# Patient Record
Sex: Female | Born: 1950 | Race: White | Hispanic: No | Marital: Married | Smoking: Never smoker
Health system: Southern US, Community
[De-identification: ages and names within clinical notes are randomized; demographics above are authoritative.]

## PROBLEM LIST (undated history)

## (undated) DIAGNOSIS — M810 Age-related osteoporosis without current pathological fracture: Secondary | ICD-10-CM

## (undated) DIAGNOSIS — K219 Gastro-esophageal reflux disease without esophagitis: Secondary | ICD-10-CM

## (undated) DIAGNOSIS — E785 Hyperlipidemia, unspecified: Secondary | ICD-10-CM

## (undated) DIAGNOSIS — F32A Depression, unspecified: Secondary | ICD-10-CM

## (undated) DIAGNOSIS — F329 Major depressive disorder, single episode, unspecified: Secondary | ICD-10-CM

## (undated) DIAGNOSIS — I10 Essential (primary) hypertension: Secondary | ICD-10-CM

## (undated) DIAGNOSIS — R079 Chest pain, unspecified: Secondary | ICD-10-CM

## (undated) DIAGNOSIS — I2510 Atherosclerotic heart disease of native coronary artery without angina pectoris: Secondary | ICD-10-CM

## (undated) HISTORY — DX: Age-related osteoporosis without current pathological fracture: M81.0

## (undated) HISTORY — PX: LUMBAR DISC SURGERY: SHX700

## (undated) HISTORY — PX: CARDIAC CATHETERIZATION: SHX172

## (undated) HISTORY — DX: Gastro-esophageal reflux disease without esophagitis: K21.9

## (undated) HISTORY — DX: Depression, unspecified: F32.A

## (undated) HISTORY — PX: ABDOMINAL EXPLORATION SURGERY: SHX538

## (undated) HISTORY — DX: Major depressive disorder, single episode, unspecified: F32.9

## (undated) HISTORY — DX: Chest pain, unspecified: R07.9

## (undated) HISTORY — DX: Essential (primary) hypertension: I10

## (undated) HISTORY — PX: TONSILLECTOMY: SUR1361

## (undated) HISTORY — DX: Hyperlipidemia, unspecified: E78.5

---

## 1995-03-13 HISTORY — PX: BREAST BIOPSY: SHX20

## 1998-03-02 ENCOUNTER — Other Ambulatory Visit: Admission: RE | Admit: 1998-03-02 | Discharge: 1998-03-02 | Payer: Self-pay | Admitting: Gynecology

## 1999-01-31 ENCOUNTER — Other Ambulatory Visit: Admission: RE | Admit: 1999-01-31 | Discharge: 1999-01-31 | Payer: Self-pay | Admitting: Gynecology

## 2000-03-18 ENCOUNTER — Other Ambulatory Visit: Admission: RE | Admit: 2000-03-18 | Discharge: 2000-03-18 | Payer: Self-pay | Admitting: Gynecology

## 2001-04-01 ENCOUNTER — Other Ambulatory Visit: Admission: RE | Admit: 2001-04-01 | Discharge: 2001-04-01 | Payer: Self-pay | Admitting: Gynecology

## 2002-04-14 ENCOUNTER — Other Ambulatory Visit: Admission: RE | Admit: 2002-04-14 | Discharge: 2002-04-14 | Payer: Self-pay | Admitting: Gynecology

## 2003-04-22 ENCOUNTER — Other Ambulatory Visit: Admission: RE | Admit: 2003-04-22 | Discharge: 2003-04-22 | Payer: Self-pay | Admitting: Gynecology

## 2004-04-27 ENCOUNTER — Other Ambulatory Visit: Admission: RE | Admit: 2004-04-27 | Discharge: 2004-04-27 | Payer: Self-pay | Admitting: Gynecology

## 2004-05-26 ENCOUNTER — Ambulatory Visit: Payer: Self-pay | Admitting: Internal Medicine

## 2004-06-02 ENCOUNTER — Ambulatory Visit: Payer: Self-pay | Admitting: Internal Medicine

## 2005-04-30 ENCOUNTER — Other Ambulatory Visit: Admission: RE | Admit: 2005-04-30 | Discharge: 2005-04-30 | Payer: Self-pay | Admitting: Gynecology

## 2008-06-27 ENCOUNTER — Observation Stay (HOSPITAL_COMMUNITY): Admission: AD | Admit: 2008-06-27 | Discharge: 2008-06-28 | Payer: Self-pay | Admitting: Internal Medicine

## 2008-06-27 ENCOUNTER — Ambulatory Visit: Payer: Self-pay | Admitting: Internal Medicine

## 2009-03-01 ENCOUNTER — Ambulatory Visit (HOSPITAL_COMMUNITY): Admission: RE | Admit: 2009-03-01 | Discharge: 2009-03-02 | Payer: Self-pay | Admitting: Neurosurgery

## 2010-06-12 LAB — CBC
HCT: 37.7 % (ref 36.0–46.0)
Hemoglobin: 13.1 g/dL (ref 12.0–15.0)
MCHC: 34.8 g/dL (ref 30.0–36.0)
MCV: 91.4 fL (ref 78.0–100.0)
Platelets: 220 K/uL (ref 150–400)
RBC: 4.13 MIL/uL (ref 3.87–5.11)
RDW: 12.2 % (ref 11.5–15.5)
WBC: 5.3 K/uL (ref 4.0–10.5)

## 2010-06-21 LAB — BASIC METABOLIC PANEL
Calcium: 8.9 mg/dL (ref 8.4–10.5)
GFR calc non Af Amer: >60 mL/min (ref >60)
Glucose, Bld: 148 mg/dL — ABNORMAL HIGH (ref 70–99)
Sodium: 139 mEq/L (ref 135–145)

## 2010-06-21 LAB — DIFFERENTIAL
Basophils Relative: 0 % (ref 0–1)
Eosinophils Absolute: 0.1 10*3/uL (ref 0.0–0.7)
Monocytes Absolute: 0.3 10*3/uL (ref 0.1–1.0)
Monocytes Relative: 8 % (ref 3–12)
Neutrophils Relative %: 63 % (ref 43–77)

## 2010-06-21 LAB — CBC
Hemoglobin: 13.4 g/dL (ref 12.0–15.0)
MCHC: 33 g/dL (ref 30.0–36.0)
MCHC: 34.6 g/dL (ref 30.0–36.0)
MCV: 92.9 fL (ref 78.0–100.0)
MCV: 94.2 fL (ref 78.0–100.0)
RBC: 4.28 MIL/uL (ref 3.87–5.11)
RBC: 4.32 MIL/uL (ref 3.87–5.11)

## 2010-06-21 LAB — ANTITHROMBIN III: AntiThromb III Func: 136 % — ABNORMAL HIGH (ref 76–126)

## 2010-06-21 LAB — LUPUS ANTICOAGULANT PANEL
DRVVT: 36.1 secs (ref 36.1–47.0)
Lupus Anticoagulant: NOT DETECTED
PTTLA 4:1 Mix: 98.8 secs — ABNORMAL HIGH (ref 36.3–48.8)

## 2010-06-21 LAB — CARDIAC PANEL(CRET KIN+CKTOT+MB+TROPI)
CK, MB: 0.5 ng/mL (ref 0.3–4.0)
CK, MB: 0.5 ng/mL (ref 0.3–4.0)

## 2010-06-21 LAB — CARDIOLIPIN ANTIBODIES, IGG, IGM, IGA

## 2010-06-21 LAB — BETA-2-GLYCOPROTEIN I ABS, IGG/M/A
Beta-2 Glyco I IgG: <4 U/mL (ref <20)
Beta-2-Glycoprotein I IgM: <4 U/mL (ref <10)

## 2010-07-25 NOTE — Cardiovascular Report (Signed)
Maria Allison, FAISON NO.:  [id number]   MEDICAL RECORD NO.:  [id number]          PATIENT TYPE:  INP   LOCATION:  2011                         FACILITY:  [facility]   PHYSICIAN:  Rollene Rotunda, MD, FACCDATE OF BIRTH:  01-06-1951   DATE OF PROCEDURE:  06/28/2008  DATE OF DISCHARGE:  06/28/2008                            CARDIAC CATHETERIZATION   PRIMARY CARE DOCTOR:  Raynelle Jan, MD   PROCEDURE:  Left heart catheterization/coronary arteriography.   INDICATIONS:  Evaluate the patient with chest pain and an stress  echocardiogram suggesting anterior ischemia.   PROCEDURE NOTE:  Left heart catheterization was performed via right the  femoral artery.  The artery was cannulated using the anterior wall  puncture.  A #5-French arterial sheath was inserted via the modified  Seldinger technique.  Preformed, Judkins, and pigtail catheters were  utilized.  The patient tolerated the procedure well and left the lab in  stable condition.   RESULTS:  Hemodynamics:  LV 90/1, AO 93/51.   Coronaries:  The left main was normal.  The LAD was a large vessel  wrapping the apex.  It was normal throughout its course.  First diagonal  was large.  There was a questionable ostial 25% stenosis.  The  circumflex  and the AV groove was normal.  There was mid obtuse  marginal, which was large and normal.  The right coronary artery was  dominant.  It was moderate-sized vessel.  It was normal throughout its  course.  The PDA was small and normal.   Left ventriculogram:  The left ventriculogram was obtained in the RAO  projection.  The EF was 65% with normal wall motion.   CONCLUSION:  Mild coronary plaque.  Normal left ventricular function.   PLAN:  No further cardiac workup is suggested.  The patient will be  referred for evaluation of nonanginal chest pain.  She should continue  the aggressive primary risk reduction given her family history.      Rollene Rotunda, MD, [facility]  Electronically Signed     JH/MEDQ  D:  06/28/2008  T:  06/29/2008  Job:  [id number]   cc:   Raynelle Jan, M.D.

## 2010-07-25 NOTE — H&P (Signed)
Maria Allison, Maria Allison NO.:  [id number]   MEDICAL RECORD NO.:  [id number]          PATIENT TYPE:  INP   LOCATION:  2011                         FACILITY:  [facility]   PHYSICIAN:  Maria Salvia, MD, FACCDATE OF BIRTH:  1950/09/17   DATE OF ADMISSION:  06/27/2008  DATE OF DISCHARGE:                              HISTORY & PHYSICAL   PRIMARY CARE PHYSICIAN:  Dr. Abner Allison in [place]   REASON FOR ADMISSION:  Chest pain and abnormal stress echocardiogram.   HISTORY OF PRESENT ILLNESS:  Ms. Maria Allison is a 60 year old female with no  history of coronary artery disease who presented to her primary  physician's office on June 25, 2008 with a complaint of chest pain.  Her symptoms occurred while washing dishes a few nights ago.  It was  left-sided and sharp.  It resolved with rest.  She continued to feel  weak and also felt weak into the next day.  As noted, she went to her  primary care physician's office and they referred to the emergency room  in [place].  She ruled out for myocardial infarction by enzymes.  She  saw Dr. Josefina Allison of [facility] yesterday who performed a  stress echocardiogram.  The patient exercised for 9 minutes.  She had  borderline EKG changes in the inferolateral leads and questionable  anterior hypokinesis on her echocardiography images.  She noted chest  and throat tightness while exercising.  Several of her family members  follow with doctors in [facility].  She requested transfer to  [facility] for further evaluation and treatment.  In  retrospect, the patient has a 2-3 week history of exertional upper chest  and throat tightness.  She also notes symptoms at rest at times,  especially when she is under a great deal of stress.  She denies  shortness of breath, nausea or diaphoresis.  She continues to note chest  and throat discomfort off and on since admission.   PAST MEDICAL HISTORY:  1. Hyperlipidemia - she is only on  diet therapy at this time.  2. Gastroesophageal reflux disease.  3. Status post laparoscopy and eventual abdominal surgery secondary to      endometriosis.  4. Status post bilateral lumpectomies in the past - benign findings.  5. Status post tonsillectomy.  6. History of the right internal carotid artery stenosis by health      care screening in the past - no formal studies performed.   MEDICATIONS AT HOME:  1. Estradiol 0.1 mg daily.  2. A multivitamin daily.  3. Calcium ready.  4,  Red Yeast Rice.  1. Fish oil.  2. B complex probiotic.  3. Aspirin daily.   ALLERGIES:  NO KNOWN DRUG ALLERGIES.  She is allergic to apples, plums,  almonds and peaches.   SOCIAL HISTORY:  The patient lives in [place] with her husband.  She has one daughter who is in college at [employer].  She admits to just occasional alcohol use.  She denies any tobacco  abuse.   FAMILY HISTORY:  Her father had PCI in his 71s  and a brother had bypass  surgery in his 26s.  There is a family history of portal vein thrombosis  for which family screening has been recommended at [facility], presumably for a  hypercoagulable condition.   REVIEW OF SYSTEMS:  Please see HPI.  Denies fevers, chills, weight  changes, headache, dysuria, hematuria, bright red blood per rectum or  melena, dysphagia, odynophagia, GERD symptoms, orthopnea, PND or pedal  edema, syncope, near-syncope or cough.  She does know weakness and  fatigue.  All other systems reviewed and negative.   PHYSICAL EXAM:  She is a well-nourished, well-developed female in no  acute stress.  Blood pressure 118/85, pulse 70, respirations 16, oxygen saturation 96%  in room air.  HEENT: Normal.  Neck without JVD.  LYMPH NODES: Without lymphadenopathy.  ENDOCRINE:  Without thyromegaly.  CARDIAC:  Normal S1-S2.  Regular rate and rhythm without murmur.  LUNGS: Clear to auscultation bilaterally.  No rales.  SKIN:  Without rash.  ABDOMEN:  Soft,  nontender.  No murmur.  Normoactive bowel sounds.  No  organomegaly.  EXTREMITIES: Without clubbing, cyanosis or edema.  MUSCULOSKELETAL:  Without joint deformity.  Neurologic: She is alert and  oriented x3.  Cranial nerves II-XII are grossly intact.  VASCULAR:  Exam  without carotid bruits bilaterally.  Femoral artery pulses are difficult  to palpate, but without bruits bilaterally.  Chest x-ray was on June 25, 2008 at [facility]:  No active disease.  EKG:  Sinus rhythm,  heart rate 56, normal axis, T-wave inversion of V1-2, low voltage.   LABS:  Hemoglobin 13.1, potassium 4.1, creatinine 0.75, glucose 73.  Cardiac enzymes negative x3.  INR 1.0, total cholesterol 239,  triglycerides 75, LDL 145.     .   ASSESSMENT:  1. Chest and neck pain concerning for unstable angina pectoris with an      abnormal stress echocardiogram.  Cardiac markers have been negative      thus far.  Her EKG is normal.  She continues to have intermittent      symptoms.  2. Dyslipidemia.  3. Family history of coronary artery disease.  4. Questionable carotid stenosis by health fair screening in the past.  5. Questionable family history of hypercoagulable state.   RECOMMENDATIONS:  The patient was also interviewed and examined by Dr.  Graciela Allison.  She will continue to have cardiac markers cycled.  She will be  treated with aspirin, statin heparin and beta-blocker and nitrates as  indicated as well as morphine p.r.n.  The risks and benefits of  proceeding with cardiac catheterization have been discussed with  the patient and her husband and she agrees to proceed.  We will plan on  cardiac catheterization the morning unless she develops objective  evidence of ischemia and ongoing symptoms.  In light of that, she will  need urgent intervention.      Tereso Newcomer, PA-C      Maria Salvia, MD, [facility]  Electronically Signed    SW/MEDQ  D:  06/27/2008  T:  06/27/2008  Job:  [id number]   cc:   Maria Salvia, MD, [facility], MD Yetta Flock

## 2010-07-25 NOTE — Discharge Summary (Signed)
NAMEBROOKELLE, Maria Allison NO.:  [id number]   MEDICAL RECORD NO.:  [id number]          PATIENT TYPE:  INP   LOCATION:  2011                         FACILITY:  [facility]   PHYSICIAN:  Rollene Rotunda, MD, FACCDATE OF BIRTH:  May 23, 1950   DATE OF ADMISSION:  06/27/2008  DATE OF DISCHARGE:  06/28/2008                               DISCHARGE SUMMARY   PRIMARY CARE PHYSICIAN:  Dr. Abner Greenspan down in [place].   DISCHARGING PHYSICIAN:  Rollene Rotunda, MD, [facility]   DISCHARGE DIAGNOSIS:  Chest pain with abnormal stress echocardiogram at  [facility].  A.  Status post cardiac catheterization this admission with normal left  ventricular ejection fraction of 65% and mild coronary plaque.  No  further cardiac workup necessary at this time.  Negative cardiac  markers.   PAST MEDICAL HISTORY:  1. Hyperlipidemia with diet therapy at this time.  2. GERD.   HOSPITAL COURSE:  Ms. Miles is a 60 year old Caucasian female who  presented to her primary care physician's office on April 16 with a  complaint of chest pain.  The patient was referred to the emergency room  in [place].  She ruled out for myocardial infarction by enzymes.  She  saw Dr. Bing Matter at [facility] who performed a stress  echocardiogram.  The patient had borderline EKG changes and questionable  anterior hypokinesis on her echocardiogram.  The patient complained of  chest and throat tightness while exercising during the study.  The  patient requested transfer to [facility] for further evaluation.  The  patient admitted and ruled out for myocardial infarction by cardiac  serial markers.  The patient treated for chest discomfort with  appropriate medications including nitrates, heparin, aspirin, and  morphine.  The patient to the cath lab on April 19, results as stated  above.  Thus far, the patient has tolerated procedure without  complication.  She is currently on bedrest.  At completion of bed rest,  if the patient remains stable, we will tentatively plan on discharging  her home this evening.  She can follow up with Dr. Abner Greenspan in  [place] in the next 2 weeks for postcath visit.  Ms. Haltiwanger has been  given post cardiac catheterization discharge instructions.  She is  encouraged of follow heart-healthy diet.  She may resume her previous  medications including aspirin 81 mg, B complex, multivitamin, red yeast  rice, fish oil, probiotic, Zantac, and estradiol as previously  prescribed.   Duration of discharge encounter less than 30 minutes.      Dorian Pod, ACNP      Rollene Rotunda, MD, [facility]  Electronically Signed    MB/MEDQ  D:  06/28/2008  T:  06/29/2008  Job:  [id number]   cc:   Abner Greenspan, MD

## 2011-09-25 ENCOUNTER — Encounter: Payer: Self-pay | Admitting: *Deleted

## 2011-09-25 ENCOUNTER — Encounter: Payer: Self-pay | Admitting: Cardiovascular Disease

## 2011-09-25 DIAGNOSIS — E785 Hyperlipidemia, unspecified: Secondary | ICD-10-CM | POA: Insufficient documentation

## 2011-09-25 DIAGNOSIS — F329 Major depressive disorder, single episode, unspecified: Secondary | ICD-10-CM | POA: Insufficient documentation

## 2011-09-25 DIAGNOSIS — R079 Chest pain, unspecified: Secondary | ICD-10-CM | POA: Insufficient documentation

## 2011-09-25 DIAGNOSIS — K219 Gastro-esophageal reflux disease without esophagitis: Secondary | ICD-10-CM | POA: Insufficient documentation

## 2011-09-25 DIAGNOSIS — M810 Age-related osteoporosis without current pathological fracture: Secondary | ICD-10-CM | POA: Insufficient documentation

## 2011-09-25 DIAGNOSIS — E7800 Pure hypercholesterolemia, unspecified: Secondary | ICD-10-CM | POA: Insufficient documentation

## 2011-09-26 ENCOUNTER — Other Ambulatory Visit: Payer: Self-pay | Admitting: *Deleted

## 2011-09-26 ENCOUNTER — Ambulatory Visit (INDEPENDENT_AMBULATORY_CARE_PROVIDER_SITE_OTHER): Payer: BC Managed Care – PPO | Admitting: Cardiovascular Disease

## 2011-09-26 ENCOUNTER — Encounter: Payer: Self-pay | Admitting: Cardiovascular Disease

## 2011-09-26 ENCOUNTER — Ambulatory Visit (INDEPENDENT_AMBULATORY_CARE_PROVIDER_SITE_OTHER)
Admission: RE | Admit: 2011-09-26 | Discharge: 2011-09-26 | Disposition: A | Discharge status: Home / Self Care | Payer: Self-pay | Source: Ambulatory Visit | Attending: Cardiovascular Disease | Admitting: Cardiovascular Disease

## 2011-09-26 VITALS — BP 110/78 | HR 57 | Ht 60.0 in | Wt 119.0 lb

## 2011-09-26 DIAGNOSIS — E7800 Pure hypercholesterolemia, unspecified: Secondary | ICD-10-CM

## 2011-09-26 DIAGNOSIS — E785 Hyperlipidemia, unspecified: Secondary | ICD-10-CM

## 2011-09-26 DIAGNOSIS — I779 Disorder of arteries and arterioles, unspecified: Secondary | ICD-10-CM

## 2011-09-26 DIAGNOSIS — R079 Chest pain, unspecified: Secondary | ICD-10-CM

## 2011-09-26 DIAGNOSIS — I2510 Atherosclerotic heart disease of native coronary artery without angina pectoris: Secondary | ICD-10-CM | POA: Insufficient documentation

## 2011-09-26 DIAGNOSIS — I6529 Occlusion and stenosis of unspecified carotid artery: Secondary | ICD-10-CM

## 2011-09-26 DIAGNOSIS — E789 Disorder of lipoprotein metabolism, unspecified: Secondary | ICD-10-CM

## 2011-09-26 DIAGNOSIS — K219 Gastro-esophageal reflux disease without esophagitis: Secondary | ICD-10-CM

## 2011-09-26 NOTE — Assessment & Plan Note (Signed)
Previous carotid duplex done elsewhere suggesting LICA plaque.  Given issues with Rx of elevated lipids will check carotid duplex to see if she has any significant disease.  No bruit and no symptoms

## 2011-09-26 NOTE — Progress Notes (Signed)
Patient ID: Maria Allison, female   DOB: 1950-07-08, 61 y.o.   MRN: [id number] 61 yo referred by Dr Marina Goodell in [place] for lipid assessment.  Previously seen by Dr Graciela Husbands and Maria Allison in 2010 for chest pain.  Cath showed no CAD and normal EF.  Has had longstanding elevated lipids.  TC as high as 275.  She is averse to taking long term statins. Has been on red yeast rice in past with 60 point drop in LDL.  She drinks red wine and her HDL is excellent/protective at 88.  Lipomed profile done 07/28/11  LDL-P 2019 goal less than 6962 LDL 185 HDL 88 Triglycerides 52 TC 283 LP-IR socre low at 8 indicating no insulin resistance  She also did a screening vascular exam ? 2 years ago and she was told she had "plaque" in her carotids especially on left side.  No symptoms of TIA  After prolonged discussion of the risks and benefits of statin use we decided to do a calcium score in our office.  Her calcium score was zero.  ROS: Denies fever, malais, weight loss, blurry vision, decreased visual acuity, cough, sputum, SOB, hemoptysis, pleuritic pain, palpitaitons, heartburn, abdominal pain, melena, lower extremity edema, claudication, or rash.  All other systems reviewed and negative   General: Affect appropriate Healthy:  appears stated age HEENT: normal Neck supple with no adenopathy JVP normal no bruits no thyromegaly Lungs clear with no wheezing and good diaphragmatic motion Heart:  S1/S2 no murmur,rub, gallop or click PMI normal Abdomen: benighn, BS positve, no tenderness, no AAA no bruit.  No HSM or HJR Distal pulses intact with no bruits No edema Neuro non-focal Skin warm and dry No muscular weakness  Medications Current Outpatient Prescriptions  Medication Sig Dispense Refill  . aspirin 81 MG tablet Take 81 mg by mouth daily.      . cetirizine (ZYRTEC) 10 MG tablet Take 10 mg by mouth daily.      . Coenzyme Q10 (COQ-10 PO) Take 1 tablet by mouth daily.      . Fish Oil OIL Take 1 tablet  by mouth daily.      Marland Kitchen omeprazole (PRILOSEC) 40 MG capsule Take 1 tablet by mouth Daily.      . Oxymetazoline HCl (NASAL SPRAY NA) Place into the nose as needed.      . sertraline (ZOLOFT) 100 MG tablet Take 100 mg by mouth daily.        Allergies Review of patient's allergies indicates no known allergies.  Family History: Family History  Problem Relation Age of Onset  . Thrombosis      There is a family history of portal vein thrombosis for which family screening has been recommended at [facility]    Social History: History   Social History  . Marital Status: Married    Spouse Name: N/A    Number of Children: N/A  . Years of Education: N/A   Occupational History  . Not on file.   Social History Main Topics  . Smoking status: Never Smoker   . Smokeless tobacco: Not on file  . Alcohol Use: Yes  . Drug Use: No  . Sexually Active: Not on file   Other Topics Concern  . Not on file   Social History Narrative   The patient lives in [place] with her husband. She has one daughter who is in college at [employer]. She admits to just occasional alcohol use.  She denies any tobacco abuse.  Electrocardiogram:   NSR rate 57 poor R wave progression   Assessment and Plan

## 2011-09-26 NOTE — Assessment & Plan Note (Signed)
I suspect she will choose not to take statin other then red yeast.  She has had a normal cath in 2010 and had a normal calcium score of 0 today.  HDL is also protective at 88.  If her carotid duplex comes back showing any significant stenosis I would probably advise her to start taking one.

## 2011-09-26 NOTE — Assessment & Plan Note (Signed)
Stable likely previously responsible for chest pain PRN H 2 blocker or proton pump inhibitor OTC

## 2011-09-26 NOTE — Assessment & Plan Note (Signed)
Resolved  Cath 2010 normal

## 2011-09-26 NOTE — Patient Instructions (Addendum)
Your physician recommends that you schedule a follow-up appointment in: AS NEEDED Your physician recommends that you continue on your current medications as directed. Please refer to the Current Medication list given to you today. Your physician has requested that you have a carotid duplex. This test is an ultrasound of the carotid arteries in your neck. It looks at blood flow through these arteries that supply the brain with blood. Allow one hour for this exam. There are no restrictions or special instructions. DX STENOSIS CALCIUM SCORE

## 2011-09-26 NOTE — Assessment & Plan Note (Signed)
>>  ASSESSMENT AND PLAN FOR CAROTID ARTERIAL DISEASE ([place]) WRITTEN ON 09/26/2011 11:03 AM BY Jenkins Rouge C, MD  Previous carotid duplex done elsewhere suggesting LICA plaque.  Given issues with Rx of elevated lipids will check carotid duplex to see if she has any significant disease.  No bruit and no symptoms

## 2011-09-26 NOTE — Assessment & Plan Note (Signed)
>>  ASSESSMENT AND PLAN FOR HYPERCHOLESTEREMIA WRITTEN ON 09/26/2011 11:01 AM BY Josue Hector, MD  I suspect she will choose not to take statin other then red yeast.  She has had a normal cath in 2010 and had a normal calcium score of 0 today.  HDL is also protective at 88.  If her carotid duplex comes back showing any significant stenosis I would probably advise her to start taking one.

## 2011-10-05 ENCOUNTER — Encounter (INDEPENDENT_AMBULATORY_CARE_PROVIDER_SITE_OTHER): Payer: BC Managed Care – PPO

## 2011-10-05 DIAGNOSIS — I6529 Occlusion and stenosis of unspecified carotid artery: Secondary | ICD-10-CM

## 2012-08-26 ENCOUNTER — Other Ambulatory Visit: Payer: Self-pay | Admitting: Gynecology

## 2012-08-26 DIAGNOSIS — R928 Other abnormal and inconclusive findings on diagnostic imaging of breast: Secondary | ICD-10-CM

## 2012-09-04 ENCOUNTER — Ambulatory Visit
Admission: RE | Admit: 2012-09-04 | Discharge: 2012-09-04 | Disposition: A | Discharge status: Home / Self Care | Payer: BC Managed Care – PPO | Source: Ambulatory Visit | Attending: Gynecology | Admitting: Gynecology

## 2012-09-04 DIAGNOSIS — R928 Other abnormal and inconclusive findings on diagnostic imaging of breast: Secondary | ICD-10-CM

## 2013-06-25 ENCOUNTER — Other Ambulatory Visit: Payer: Self-pay

## 2013-06-25 DIAGNOSIS — Z1231 Encounter for screening mammogram for malignant neoplasm of breast: Secondary | ICD-10-CM

## 2013-08-26 ENCOUNTER — Ambulatory Visit: Payer: BC Managed Care – PPO

## 2014-04-17 ENCOUNTER — Encounter: Payer: Self-pay | Admitting: Internal Medicine

## 2014-08-25 LAB — HM COLONOSCOPY

## 2014-10-20 ENCOUNTER — Encounter: Payer: Self-pay | Admitting: Internal Medicine

## 2015-12-05 DIAGNOSIS — Z0000 Encounter for general adult medical examination without abnormal findings: Secondary | ICD-10-CM | POA: Diagnosis not present

## 2015-12-06 DIAGNOSIS — Z23 Encounter for immunization: Secondary | ICD-10-CM | POA: Diagnosis not present

## 2015-12-06 DIAGNOSIS — E039 Hypothyroidism, unspecified: Secondary | ICD-10-CM | POA: Diagnosis not present

## 2015-12-06 DIAGNOSIS — F341 Dysthymic disorder: Secondary | ICD-10-CM | POA: Diagnosis not present

## 2015-12-06 DIAGNOSIS — Z0000 Encounter for general adult medical examination without abnormal findings: Secondary | ICD-10-CM | POA: Diagnosis not present

## 2015-12-12 DIAGNOSIS — Z1211 Encounter for screening for malignant neoplasm of colon: Secondary | ICD-10-CM | POA: Diagnosis not present

## 2015-12-13 DIAGNOSIS — H04123 Dry eye syndrome of bilateral lacrimal glands: Secondary | ICD-10-CM | POA: Diagnosis not present

## 2015-12-23 DIAGNOSIS — F341 Dysthymic disorder: Secondary | ICD-10-CM | POA: Diagnosis not present

## 2015-12-23 DIAGNOSIS — E039 Hypothyroidism, unspecified: Secondary | ICD-10-CM | POA: Diagnosis not present

## 2016-01-02 DIAGNOSIS — R300 Dysuria: Secondary | ICD-10-CM | POA: Diagnosis not present

## 2016-01-02 DIAGNOSIS — N760 Acute vaginitis: Secondary | ICD-10-CM | POA: Diagnosis not present

## 2016-01-10 DIAGNOSIS — H25812 Combined forms of age-related cataract, left eye: Secondary | ICD-10-CM | POA: Diagnosis not present

## 2016-01-19 DIAGNOSIS — H25812 Combined forms of age-related cataract, left eye: Secondary | ICD-10-CM | POA: Diagnosis not present

## 2016-01-19 DIAGNOSIS — H2512 Age-related nuclear cataract, left eye: Secondary | ICD-10-CM | POA: Diagnosis not present

## 2016-01-23 ENCOUNTER — Telehealth: Payer: Self-pay

## 2016-01-23 DIAGNOSIS — E7849 Other hyperlipidemia: Secondary | ICD-10-CM

## 2016-01-23 DIAGNOSIS — J019 Acute sinusitis, unspecified: Secondary | ICD-10-CM | POA: Diagnosis not present

## 2016-01-23 NOTE — Telephone Encounter (Signed)
Dr. Reinaldo Meeker from [facility] faxed request for Ca Score for genetic cholesterol issues. Per Dr. Johnsie Cancel, ordered test and he will read results. Patient will be called by scheduler to make appointment for test.

## 2016-02-01 ENCOUNTER — Encounter: Payer: Self-pay | Admitting: Radiology

## 2016-02-01 ENCOUNTER — Ambulatory Visit (INDEPENDENT_AMBULATORY_CARE_PROVIDER_SITE_OTHER)
Admission: RE | Admit: 2016-02-01 | Discharge: 2016-02-01 | Disposition: A | Discharge status: Home / Self Care | Payer: Self-pay | Source: Ambulatory Visit | Attending: Cardiovascular Disease | Admitting: Cardiovascular Disease

## 2016-02-01 DIAGNOSIS — E7849 Other hyperlipidemia: Secondary | ICD-10-CM

## 2016-02-01 DIAGNOSIS — E784 Other hyperlipidemia: Secondary | ICD-10-CM

## 2016-02-06 ENCOUNTER — Telehealth: Payer: Self-pay | Admitting: Pediatrics

## 2016-02-06 DIAGNOSIS — E785 Hyperlipidemia, unspecified: Secondary | ICD-10-CM

## 2016-02-06 MED ORDER — ROSUVASTATIN CALCIUM 5 MG PO TABS: 5.0000 mg | ORAL_TABLET | ORAL | 3 refills | Status: DC

## 2016-02-06 NOTE — Telephone Encounter (Signed)
-----   Message from Josue Hector, MD sent at 02/01/2016 10:17 AM EST ----- Calcium score 53 which is above average for age was 0 in 2013 Consider taking crestor 5 mg 3x/week and repeating labs

## 2016-02-06 NOTE — Telephone Encounter (Signed)
I spoke with patient and advised of the Calcium score and recommendations from Dr Johnsie Cancel and medication change. She states she would like to have repeat labs drawn at Dr [facility] office (PCP) in [place]. I entered and faxed order to that office. Rx sent in. Pt voiced understanding and agreed with plan.

## 2016-02-07 NOTE — Telephone Encounter (Signed)
Informed patient about her Ca Score and scheduled patient to have lab work on 02/15/16. Patient verbalized understanding and will start taking crestor 5 mg three times a week.

## 2016-02-07 NOTE — Telephone Encounter (Signed)
Pt calling back with question regarding her Calcium score-pls call [phone]

## 2016-02-15 ENCOUNTER — Other Ambulatory Visit (INDEPENDENT_AMBULATORY_CARE_PROVIDER_SITE_OTHER): Payer: PPO

## 2016-02-15 DIAGNOSIS — E785 Hyperlipidemia, unspecified: Secondary | ICD-10-CM | POA: Diagnosis not present

## 2016-02-15 LAB — HEPATIC FUNCTION PANEL
ALT: 14 U/L (ref 6–29)
AST: 19 U/L (ref 10–35)
Albumin: 4.4 g/dL (ref 3.6–5.1)
Alkaline Phosphatase: 61 U/L (ref 33–130)
BILIRUBIN DIRECT: 0.1 mg/dL (ref <=0.2)
BILIRUBIN INDIRECT: 0.5 mg/dL (ref 0.2–1.2)
Total Bilirubin: 0.6 mg/dL (ref 0.2–1.2)
Total Protein: 6.7 g/dL (ref 6.1–8.1)

## 2016-02-15 LAB — LIPID PANEL
CHOL/HDL RATIO: 2.5 ratio (ref <5.0)
CHOLESTEROL: 250 mg/dL — AB (ref <200)
HDL: 101 mg/dL (ref >50)
LDL Cholesterol: 137 mg/dL — ABNORMAL HIGH (ref <100)
Triglycerides: 59 mg/dL (ref <150)
VLDL: 12 mg/dL (ref <30)

## 2016-02-16 ENCOUNTER — Telehealth: Payer: Self-pay

## 2016-02-16 DIAGNOSIS — E7800 Pure hypercholesterolemia, unspecified: Secondary | ICD-10-CM

## 2016-02-16 MED ORDER — ROSUVASTATIN CALCIUM 10 MG PO TABS: 10.0000 mg | ORAL_TABLET | ORAL | 3 refills | Status: DC

## 2016-02-16 NOTE — Telephone Encounter (Signed)
Called patient with lab results. Per Dr. Johnsie Cancel, LDL 137 too high for someone with calcium score 70th percentile Increase crestor to 10 mg and f/u labs in 3 months. Patient verbalized understanding.

## 2016-02-16 NOTE — Telephone Encounter (Signed)
-----   Message from Josue Hector, MD sent at 02/16/2016  1:37 PM EST ----- LDL 137 too high for someone with calcium score 70th percentile Increase crestor to 10 mg and f/u labs in 3 months

## 2016-02-20 DIAGNOSIS — H2511 Age-related nuclear cataract, right eye: Secondary | ICD-10-CM | POA: Diagnosis not present

## 2016-02-20 DIAGNOSIS — H25811 Combined forms of age-related cataract, right eye: Secondary | ICD-10-CM | POA: Diagnosis not present

## 2016-02-27 DIAGNOSIS — J018 Other acute sinusitis: Secondary | ICD-10-CM | POA: Diagnosis not present

## 2016-05-15 ENCOUNTER — Other Ambulatory Visit: Payer: PPO

## 2016-05-16 ENCOUNTER — Other Ambulatory Visit: Payer: PPO | Admitting: *Deleted

## 2016-05-16 DIAGNOSIS — E7800 Pure hypercholesterolemia, unspecified: Secondary | ICD-10-CM | POA: Diagnosis not present

## 2016-05-16 LAB — LIPID PANEL
CHOL/HDL RATIO: 2.5 ratio (ref 0.0–4.4)
Cholesterol, Total: 260 mg/dL — ABNORMAL HIGH (ref 100–199)
HDL: 102 mg/dL (ref >39)
LDL CALC: 143 mg/dL — AB (ref 0–99)
Triglycerides: 76 mg/dL (ref 0–149)
VLDL Cholesterol Cal: 15 mg/dL (ref 5–40)

## 2016-06-12 ENCOUNTER — Encounter: Payer: Self-pay | Admitting: Cardiovascular Disease

## 2016-06-22 NOTE — Progress Notes (Signed)
Cardiology Office Note   Date:  06/26/2016   ID:  VERDELL KINCANNON, DOB 1950/07/08, MRN [id number]  PCP:  Lillard Anes, MD  Cardiologist:   Jenkins Rouge, MD   Chief Complaint  Patient presents with  . Establish Care      History of Present Illness: Maria Allison is a 66 y.o. female who presents for evaluation of CAD and cholesterol.  Referred by Dr Henrene Pastor [facility].  Seen by Dr Percival Spanish in 2010 with normal cath. She has had LDL in 180 range and particle size over 2000. Drinks red wine And HDL very good in 80's has had red yeast in past but averse to long term statins  Calcium score done 09/2011 was 0  She had repeat Calcium scoring done 02/01/16 and had risen to 37 which was 70th percentile for age and sex. Advised to take statin at least 3 x/week  Reviewed multiple records from [facility] and only issues were vertigo, sinusitis and arthritis in hands  Labs from 12/27/15 TC 316 Triglycerides 82 LDL 211 LFTls normal  TSH 3.9   Discussed genetic nature of her elevated cholesterol Her diet is fine Suggested she take her crestor daily PSK9 drugs would be too expensive for primary prevention  Past Medical History:  Diagnosis Date  . Chest pain   . Depression   . GERD (gastroesophageal reflux disease)   . Hyperlipemia   . Osteoporosis     Past Surgical History:  Procedure Laterality Date  . CARDIAC CATHETERIZATION    . LUMBAR DISC SURGERY    . TONSILLECTOMY       Current Outpatient Prescriptions  Medication Sig Dispense Refill  . aspirin 81 MG tablet Take 81 mg by mouth daily.    . cetirizine (ZYRTEC) 10 MG tablet Take 10 mg by mouth daily.    . Cholecalciferol (VITAMIN D) 2000 units CAPS Take 1 capsule by mouth daily.    . Coenzyme Q10 (COQ-10 PO) Take 1 tablet by mouth daily.    . Fish Oil OIL Take 1 tablet by mouth daily.    . Multiple Vitamins-Minerals (MULTIVITAMIN ADULT PO) Take 1 tablet by mouth daily.    Marland Kitchen  omeprazole (PRILOSEC) 40 MG capsule Take 1 tablet by mouth Daily.    . Oxymetazoline HCl (NASAL SPRAY NA) Place into the nose as needed.    . Probiotic Product (PROBIOTIC PO) Take 1 tablet by mouth daily.    . sertraline (ZOLOFT) 25 MG tablet Take 25 mg by mouth every other day.    . rosuvastatin (CRESTOR) 10 MG tablet Take 1 tablet (10 mg total) by mouth 3 (three) times a week. 36 tablet 3   No current facility-administered medications for this visit.     Allergies:   Patient has no known allergies.    Social History:  The patient  reports that she has never smoked. She has never used smokeless tobacco. She reports that she drinks alcohol. She reports that she does not use drugs.   Family History:  The patient's family history is not on file.    ROS:  Please see the history of present illness.   Otherwise, review of systems are positive for none.   All other systems are reviewed and negative.    PHYSICAL EXAM: VS:  BP 124/86   Pulse (!) 58   Ht 5\' 1"  (1.549 m)   Wt 117 lb 12.8 oz (53.4 kg)   BMI 22.26 kg/m  ,  BMI Body mass index is 22.26 kg/m. Affect appropriate Healthy:  appears stated age 74: normal Neck supple with no adenopathy JVP normal no bruits no thyromegaly Lungs clear with no wheezing and good diaphragmatic motion Heart:  S1/S2 no murmur, no rub, gallop or click PMI normal Abdomen: benighn, BS positve, no tenderness, no AAA no bruit.  No HSM or HJR Distal pulses intact with no bruits No edema Neuro non-focal Skin warm and dry No muscular weakness    EKG:  09/2011 SR rate 56 normal other than low voltage 12/05/15  SR rate 54 normal ECG  06/26/16 SR rate 58 low voltage   Recent Labs: 02/15/2016: ALT 14    Lipid Panel    Component Value Date/Time   CHOL 260 (H) 05/16/2016 1051   TRIG 76 05/16/2016 1051   HDL 102 05/16/2016 1051   CHOLHDL 2.5 05/16/2016 1051   CHOLHDL 2.5 02/15/2016 0904   VLDL 12 02/15/2016 0904   LDLCALC 143 (H) 05/16/2016 1051        Wt Readings from Last 3 Encounters:  06/26/16 117 lb 12.8 oz (53.4 kg)  09/26/11 119 lb (54 kg)      Other studies Reviewed: Additional studies/ records that were reviewed today include: Calcium score 2013, 2017 labs notes primary Dr Henrene Pastor .carotid duplex 2013 which showed no plaque or disease     ASSESSMENT AND PLAN:  1.  Cholesterol  No documented vascular disease suggested taking crestor daily f/u labs with Dr Neville Route 3 months target LDL under 160 Suggested switching to high dose lipitor in a year or two to minimize Side effects and ultimately can consider PSK9 when prices drop for primary prevention. Repeat Calcium Score in 01/2019 2. CAD calcium score 70th  percentile no symptoms normal ECG no indication for stress testing  3. Allergies add flonase to zyrtec  4. GERD low carb diet prilosec 5. Depression post divorce continue zoloft    Current medicines are reviewed at length with the patient today.  The patient does not have concerns regarding medicines.  The following changes have been made:  no change  Labs/ tests ordered today include: none  No orders of the defined types were placed in this encounter.    Disposition:   FU with me in a year      Signed, Jenkins Rouge, MD  06/26/2016 9:54 AM    [facility] [place], [place], [place]  [address] Phone: [phone]; Fax: [phone]

## 2016-06-26 ENCOUNTER — Encounter: Payer: Self-pay | Admitting: Cardiovascular Disease

## 2016-06-26 ENCOUNTER — Ambulatory Visit (INDEPENDENT_AMBULATORY_CARE_PROVIDER_SITE_OTHER): Payer: PPO | Admitting: Cardiovascular Disease

## 2016-06-26 ENCOUNTER — Ambulatory Visit (INDEPENDENT_AMBULATORY_CARE_PROVIDER_SITE_OTHER): Payer: PPO | Admitting: Pharmacist

## 2016-06-26 VITALS — BP 124/86 | HR 58 | Ht 61.0 in | Wt 117.8 lb

## 2016-06-26 DIAGNOSIS — E785 Hyperlipidemia, unspecified: Secondary | ICD-10-CM

## 2016-06-26 MED ORDER — ROSUVASTATIN CALCIUM 10 MG PO TABS: 10.0000 mg | ORAL_TABLET | Freq: Every day | ORAL | 3 refills | Status: DC

## 2016-06-26 NOTE — Progress Notes (Signed)
Patient ID: Maria Allison                 DOB: Mar 14, 1950                    MRN: [id number]     HPI: Maria Allison is a 66 y.o. female patient referred to lipid clinic by Dr Johnsie Cancel. PMH is significant for chest pain, mild calcium in proximal and mid LAD, and HLD. Pt had a carotid duplex in 2013 which was normal and underwent a calcium scoring test which was 0.  In 2017, pt again underwent a coronary calcium score which showed an increase in calcium score of 53 which was 70th percentile when matched for age and sex.  Pt has been hesitant about taking statins in the past after reading about side effects such as myalgias and memory loss. She currently takes Crestor 10mg  3x per week and has not had any problems tolerating it. Pt understands that she will likely need daily statin therapy given her family history of CAD and progression of her calcium score from 0 in 2013 to 53 in 2017. Of note, her 10 year ASCVD risk is only 4.6% (does not smoke, is not overweight, no DM or HTN), however 10 year risk score does not account for family history.  Current Medications: Crestor 10mg  3x per week, fish oil 1g daily Intolerances: none Risk Factors: mild calcium in proximal and mid LAD, family history LDL goal: 100mg /dL - strong family history and mild calcium buildup  Diet: Cereal or banana with peanut butter for breakfast. Leftovers for lunch. Dinner - pasta, seafood, greens.  Exercise: Walks frequently. Gardens, yoga, and housecleaning. 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  Family History: Strong family history of CAD. Maternal grandmother died from heart disease. Mother had CHF. Father had MI at age 68. Paternal grandfather died from MI at age 25.  Social History: Denies tobacco and illicit drug use. Does drink alcohol occasionally.  Labs: 05/16/16: TC 260, TG 76, HDL 102, LDL 143 (Crestor 10mg  3x per week)  Past Medical History:  Diagnosis Date  . Chest pain   . Depression   . GERD  (gastroesophageal reflux disease)   . Hyperlipemia   . Osteoporosis     Current Outpatient Prescriptions on File Prior to Visit  Medication Sig Dispense Refill  . aspirin 81 MG tablet Take 81 mg by mouth daily.    . cetirizine (ZYRTEC) 10 MG tablet Take 10 mg by mouth daily.    . Coenzyme Q10 (COQ-10 PO) Take 1 tablet by mouth daily.    . Fish Oil OIL Take 1 tablet by mouth daily.    Marland Kitchen omeprazole (PRILOSEC) 40 MG capsule Take 1 tablet by mouth Daily.    . Oxymetazoline HCl (NASAL SPRAY NA) Place into the nose as needed.    . rosuvastatin (CRESTOR) 10 MG tablet Take 1 tablet (10 mg total) by mouth 3 (three) times a week. 36 tablet 3  . sertraline (ZOLOFT) 100 MG tablet Take 100 mg by mouth daily.     No current facility-administered medications on file prior to visit.     No Known Allergies  Assessment/Plan:  1. Hyperlipidemia - LDL 143 above goal 100mg /dL given family history and mild calcium in proximal and mid LAD. Pt agreeable to increasing Crestor to 10mg  daily. Her 10 year ASCVD risk is only 4.6% (no DM, HTN, smoking, or obesity). She will continue to remain active and her PCP will  check f/u labs in 2-3 months at her annual physical.   Megan E. Supple, PharmD, CPP, [place] [address], [place], [place] [address] Phone: [phone]; Fax: [phone] 06/26/2016 10:42 AM

## 2016-06-26 NOTE — Patient Instructions (Addendum)
Medication Instructions:  Your physician recommends that you continue on your current medications as directed. Please refer to the Current Medication list given to you today.  Labwork: NONE  Testing/Procedures: NONE  Follow-Up: Your physician wants you to follow-up as needed with  Dr. Nishan.    If you need a refill on your cardiac medications before your next appointment, please call your pharmacy.    

## 2016-06-28 NOTE — Addendum Note (Signed)
Addended by: Roberts Gaudy on: 06/28/2016 09:14 AM   Modules accepted: Orders

## 2016-09-26 DIAGNOSIS — M25541 Pain in joints of right hand: Secondary | ICD-10-CM | POA: Diagnosis not present

## 2016-09-26 DIAGNOSIS — M25542 Pain in joints of left hand: Secondary | ICD-10-CM | POA: Diagnosis not present

## 2016-09-26 DIAGNOSIS — H9201 Otalgia, right ear: Secondary | ICD-10-CM | POA: Diagnosis not present

## 2016-09-26 DIAGNOSIS — M545 Low back pain: Secondary | ICD-10-CM | POA: Diagnosis not present

## 2016-11-14 DIAGNOSIS — Z1231 Encounter for screening mammogram for malignant neoplasm of breast: Secondary | ICD-10-CM | POA: Diagnosis not present

## 2016-11-14 DIAGNOSIS — Z6822 Body mass index (BMI) 22.0-22.9, adult: Secondary | ICD-10-CM | POA: Diagnosis not present

## 2016-11-14 DIAGNOSIS — Z124 Encounter for screening for malignant neoplasm of cervix: Secondary | ICD-10-CM | POA: Diagnosis not present

## 2016-11-14 DIAGNOSIS — Z01419 Encounter for gynecological examination (general) (routine) without abnormal findings: Secondary | ICD-10-CM | POA: Diagnosis not present

## 2016-12-03 DIAGNOSIS — H02831 Dermatochalasis of right upper eyelid: Secondary | ICD-10-CM | POA: Diagnosis not present

## 2016-12-03 DIAGNOSIS — H04123 Dry eye syndrome of bilateral lacrimal glands: Secondary | ICD-10-CM | POA: Diagnosis not present

## 2017-01-03 DIAGNOSIS — F320 Major depressive disorder, single episode, mild: Secondary | ICD-10-CM | POA: Diagnosis not present

## 2017-01-03 DIAGNOSIS — Z0001 Encounter for general adult medical examination with abnormal findings: Secondary | ICD-10-CM | POA: Diagnosis not present

## 2017-01-03 DIAGNOSIS — R7301 Impaired fasting glucose: Secondary | ICD-10-CM | POA: Diagnosis not present

## 2017-01-03 DIAGNOSIS — Z6823 Body mass index (BMI) 23.0-23.9, adult: Secondary | ICD-10-CM | POA: Diagnosis not present

## 2017-01-03 DIAGNOSIS — E782 Mixed hyperlipidemia: Secondary | ICD-10-CM | POA: Diagnosis not present

## 2017-01-03 DIAGNOSIS — Z23 Encounter for immunization: Secondary | ICD-10-CM | POA: Diagnosis not present

## 2017-01-03 DIAGNOSIS — N952 Postmenopausal atrophic vaginitis: Secondary | ICD-10-CM | POA: Diagnosis not present

## 2017-01-22 DIAGNOSIS — H02413 Mechanical ptosis of bilateral eyelids: Secondary | ICD-10-CM | POA: Diagnosis not present

## 2017-01-25 DIAGNOSIS — M8580 Other specified disorders of bone density and structure, unspecified site: Secondary | ICD-10-CM | POA: Diagnosis not present

## 2017-01-25 DIAGNOSIS — M8589 Other specified disorders of bone density and structure, multiple sites: Secondary | ICD-10-CM | POA: Diagnosis not present

## 2017-01-25 DIAGNOSIS — N958 Other specified menopausal and perimenopausal disorders: Secondary | ICD-10-CM | POA: Diagnosis not present

## 2017-03-14 DIAGNOSIS — M62830 Muscle spasm of back: Secondary | ICD-10-CM | POA: Diagnosis not present

## 2017-03-14 DIAGNOSIS — S335XXA Sprain of ligaments of lumbar spine, initial encounter: Secondary | ICD-10-CM | POA: Diagnosis not present

## 2017-03-14 DIAGNOSIS — M9902 Segmental and somatic dysfunction of thoracic region: Secondary | ICD-10-CM | POA: Diagnosis not present

## 2017-03-14 DIAGNOSIS — M9903 Segmental and somatic dysfunction of lumbar region: Secondary | ICD-10-CM | POA: Diagnosis not present

## 2017-03-14 DIAGNOSIS — M9905 Segmental and somatic dysfunction of pelvic region: Secondary | ICD-10-CM | POA: Diagnosis not present

## 2017-03-19 DIAGNOSIS — L814 Other melanin hyperpigmentation: Secondary | ICD-10-CM | POA: Diagnosis not present

## 2017-03-19 DIAGNOSIS — L820 Inflamed seborrheic keratosis: Secondary | ICD-10-CM | POA: Diagnosis not present

## 2017-03-19 DIAGNOSIS — R233 Spontaneous ecchymoses: Secondary | ICD-10-CM | POA: Diagnosis not present

## 2017-03-21 DIAGNOSIS — S335XXA Sprain of ligaments of lumbar spine, initial encounter: Secondary | ICD-10-CM | POA: Diagnosis not present

## 2017-03-21 DIAGNOSIS — M62830 Muscle spasm of back: Secondary | ICD-10-CM | POA: Diagnosis not present

## 2017-03-21 DIAGNOSIS — M9903 Segmental and somatic dysfunction of lumbar region: Secondary | ICD-10-CM | POA: Diagnosis not present

## 2017-03-21 DIAGNOSIS — M9905 Segmental and somatic dysfunction of pelvic region: Secondary | ICD-10-CM | POA: Diagnosis not present

## 2017-03-21 DIAGNOSIS — M9902 Segmental and somatic dysfunction of thoracic region: Secondary | ICD-10-CM | POA: Diagnosis not present

## 2017-03-26 DIAGNOSIS — M9905 Segmental and somatic dysfunction of pelvic region: Secondary | ICD-10-CM | POA: Diagnosis not present

## 2017-03-26 DIAGNOSIS — S335XXA Sprain of ligaments of lumbar spine, initial encounter: Secondary | ICD-10-CM | POA: Diagnosis not present

## 2017-03-26 DIAGNOSIS — M9902 Segmental and somatic dysfunction of thoracic region: Secondary | ICD-10-CM | POA: Diagnosis not present

## 2017-03-26 DIAGNOSIS — M62830 Muscle spasm of back: Secondary | ICD-10-CM | POA: Diagnosis not present

## 2017-03-26 DIAGNOSIS — M9903 Segmental and somatic dysfunction of lumbar region: Secondary | ICD-10-CM | POA: Diagnosis not present

## 2017-04-02 DIAGNOSIS — S335XXA Sprain of ligaments of lumbar spine, initial encounter: Secondary | ICD-10-CM | POA: Diagnosis not present

## 2017-04-02 DIAGNOSIS — M62830 Muscle spasm of back: Secondary | ICD-10-CM | POA: Diagnosis not present

## 2017-04-02 DIAGNOSIS — M9902 Segmental and somatic dysfunction of thoracic region: Secondary | ICD-10-CM | POA: Diagnosis not present

## 2017-04-02 DIAGNOSIS — M9903 Segmental and somatic dysfunction of lumbar region: Secondary | ICD-10-CM | POA: Diagnosis not present

## 2017-04-02 DIAGNOSIS — M9905 Segmental and somatic dysfunction of pelvic region: Secondary | ICD-10-CM | POA: Diagnosis not present

## 2017-04-09 DIAGNOSIS — S335XXA Sprain of ligaments of lumbar spine, initial encounter: Secondary | ICD-10-CM | POA: Diagnosis not present

## 2017-04-09 DIAGNOSIS — M62830 Muscle spasm of back: Secondary | ICD-10-CM | POA: Diagnosis not present

## 2017-04-09 DIAGNOSIS — M9902 Segmental and somatic dysfunction of thoracic region: Secondary | ICD-10-CM | POA: Diagnosis not present

## 2017-04-09 DIAGNOSIS — M9905 Segmental and somatic dysfunction of pelvic region: Secondary | ICD-10-CM | POA: Diagnosis not present

## 2017-04-09 DIAGNOSIS — M9903 Segmental and somatic dysfunction of lumbar region: Secondary | ICD-10-CM | POA: Diagnosis not present

## 2017-04-24 DIAGNOSIS — H02412 Mechanical ptosis of left eyelid: Secondary | ICD-10-CM | POA: Diagnosis not present

## 2017-04-24 DIAGNOSIS — H5789 Other specified disorders of eye and adnexa: Secondary | ICD-10-CM | POA: Diagnosis not present

## 2017-04-24 DIAGNOSIS — H02413 Mechanical ptosis of bilateral eyelids: Secondary | ICD-10-CM | POA: Diagnosis not present

## 2017-04-24 DIAGNOSIS — H02411 Mechanical ptosis of right eyelid: Secondary | ICD-10-CM | POA: Diagnosis not present

## 2017-05-28 DIAGNOSIS — Z23 Encounter for immunization: Secondary | ICD-10-CM | POA: Diagnosis not present

## 2017-05-28 DIAGNOSIS — F320 Major depressive disorder, single episode, mild: Secondary | ICD-10-CM | POA: Diagnosis not present

## 2017-05-28 DIAGNOSIS — E782 Mixed hyperlipidemia: Secondary | ICD-10-CM | POA: Diagnosis not present

## 2017-05-28 DIAGNOSIS — Z0184 Encounter for antibody response examination: Secondary | ICD-10-CM | POA: Diagnosis not present

## 2017-06-07 DIAGNOSIS — H04203 Unspecified epiphora, bilateral lacrimal glands: Secondary | ICD-10-CM | POA: Diagnosis not present

## 2017-06-19 DIAGNOSIS — J301 Allergic rhinitis due to pollen: Secondary | ICD-10-CM | POA: Diagnosis not present

## 2017-07-29 DIAGNOSIS — D492 Neoplasm of unspecified behavior of bone, soft tissue, and skin: Secondary | ICD-10-CM | POA: Diagnosis not present

## 2017-08-01 ENCOUNTER — Other Ambulatory Visit: Payer: Self-pay | Admitting: Cardiovascular Disease

## 2017-09-17 DIAGNOSIS — S335XXA Sprain of ligaments of lumbar spine, initial encounter: Secondary | ICD-10-CM | POA: Diagnosis not present

## 2017-09-17 DIAGNOSIS — M9902 Segmental and somatic dysfunction of thoracic region: Secondary | ICD-10-CM | POA: Diagnosis not present

## 2017-09-17 DIAGNOSIS — M9903 Segmental and somatic dysfunction of lumbar region: Secondary | ICD-10-CM | POA: Diagnosis not present

## 2017-09-17 DIAGNOSIS — M9905 Segmental and somatic dysfunction of pelvic region: Secondary | ICD-10-CM | POA: Diagnosis not present

## 2017-09-19 DIAGNOSIS — M9903 Segmental and somatic dysfunction of lumbar region: Secondary | ICD-10-CM | POA: Diagnosis not present

## 2017-09-19 DIAGNOSIS — S335XXA Sprain of ligaments of lumbar spine, initial encounter: Secondary | ICD-10-CM | POA: Diagnosis not present

## 2017-09-19 DIAGNOSIS — M9902 Segmental and somatic dysfunction of thoracic region: Secondary | ICD-10-CM | POA: Diagnosis not present

## 2017-09-19 DIAGNOSIS — M9905 Segmental and somatic dysfunction of pelvic region: Secondary | ICD-10-CM | POA: Diagnosis not present

## 2017-09-23 DIAGNOSIS — M9905 Segmental and somatic dysfunction of pelvic region: Secondary | ICD-10-CM | POA: Diagnosis not present

## 2017-09-23 DIAGNOSIS — S335XXA Sprain of ligaments of lumbar spine, initial encounter: Secondary | ICD-10-CM | POA: Diagnosis not present

## 2017-09-23 DIAGNOSIS — M9902 Segmental and somatic dysfunction of thoracic region: Secondary | ICD-10-CM | POA: Diagnosis not present

## 2017-09-23 DIAGNOSIS — M9903 Segmental and somatic dysfunction of lumbar region: Secondary | ICD-10-CM | POA: Diagnosis not present

## 2017-09-26 DIAGNOSIS — M9905 Segmental and somatic dysfunction of pelvic region: Secondary | ICD-10-CM | POA: Diagnosis not present

## 2017-09-26 DIAGNOSIS — M9902 Segmental and somatic dysfunction of thoracic region: Secondary | ICD-10-CM | POA: Diagnosis not present

## 2017-09-26 DIAGNOSIS — M9903 Segmental and somatic dysfunction of lumbar region: Secondary | ICD-10-CM | POA: Diagnosis not present

## 2017-09-26 DIAGNOSIS — S335XXA Sprain of ligaments of lumbar spine, initial encounter: Secondary | ICD-10-CM | POA: Diagnosis not present

## 2017-10-01 DIAGNOSIS — S335XXA Sprain of ligaments of lumbar spine, initial encounter: Secondary | ICD-10-CM | POA: Diagnosis not present

## 2017-10-01 DIAGNOSIS — M9905 Segmental and somatic dysfunction of pelvic region: Secondary | ICD-10-CM | POA: Diagnosis not present

## 2017-10-01 DIAGNOSIS — M9903 Segmental and somatic dysfunction of lumbar region: Secondary | ICD-10-CM | POA: Diagnosis not present

## 2017-10-01 DIAGNOSIS — M9902 Segmental and somatic dysfunction of thoracic region: Secondary | ICD-10-CM | POA: Diagnosis not present

## 2017-12-05 DIAGNOSIS — H04123 Dry eye syndrome of bilateral lacrimal glands: Secondary | ICD-10-CM | POA: Diagnosis not present

## 2017-12-05 DIAGNOSIS — Z961 Presence of intraocular lens: Secondary | ICD-10-CM | POA: Diagnosis not present

## 2017-12-11 DIAGNOSIS — Z1231 Encounter for screening mammogram for malignant neoplasm of breast: Secondary | ICD-10-CM | POA: Diagnosis not present

## 2017-12-11 DIAGNOSIS — Z01419 Encounter for gynecological examination (general) (routine) without abnormal findings: Secondary | ICD-10-CM | POA: Diagnosis not present

## 2018-02-13 DIAGNOSIS — E782 Mixed hyperlipidemia: Secondary | ICD-10-CM | POA: Diagnosis not present

## 2018-02-13 DIAGNOSIS — Z23 Encounter for immunization: Secondary | ICD-10-CM | POA: Diagnosis not present

## 2018-02-13 DIAGNOSIS — F320 Major depressive disorder, single episode, mild: Secondary | ICD-10-CM | POA: Diagnosis not present

## 2018-02-13 DIAGNOSIS — Z6823 Body mass index (BMI) 23.0-23.9, adult: Secondary | ICD-10-CM | POA: Diagnosis not present

## 2018-02-13 DIAGNOSIS — Z0001 Encounter for general adult medical examination with abnormal findings: Secondary | ICD-10-CM | POA: Diagnosis not present

## 2018-02-13 DIAGNOSIS — K219 Gastro-esophageal reflux disease without esophagitis: Secondary | ICD-10-CM | POA: Diagnosis not present

## 2018-02-13 DIAGNOSIS — R5383 Other fatigue: Secondary | ICD-10-CM | POA: Diagnosis not present

## 2018-04-21 IMAGING — CT CT HEART SCORING
2 series · 16 of 20 positions shown, 18 images · non-contrast
Comparison: 09/26/2011

CLINICAL DATA: Risk stratification

EXAM:
Coronary Calcium Score
TECHNIQUE: The patient was scanned on a Siemens Somatom 64 scanner. Axial
non-contrast 3mm slices were carried out through the heart. The data
set was analyzed on a dedicated work station and scored using the
Agatson method.

[Series 2: casc 3.0 i36f 2 bestdiast 65 % · axial · 0.30mm/px · z∈[-223,-124]mm · 8 of 43 slices shown, 10 images]
[im 5/43  vessel]
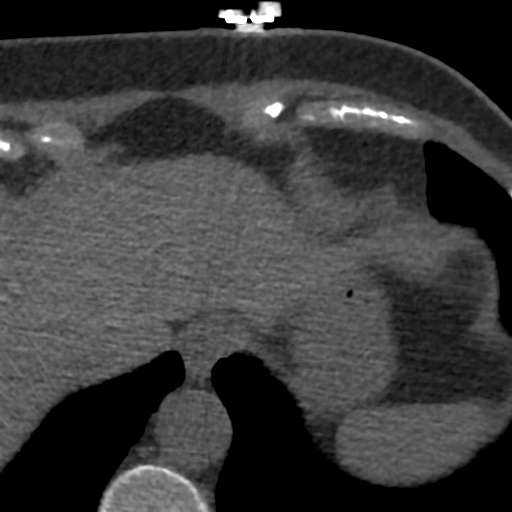
[im 5/43  lung]
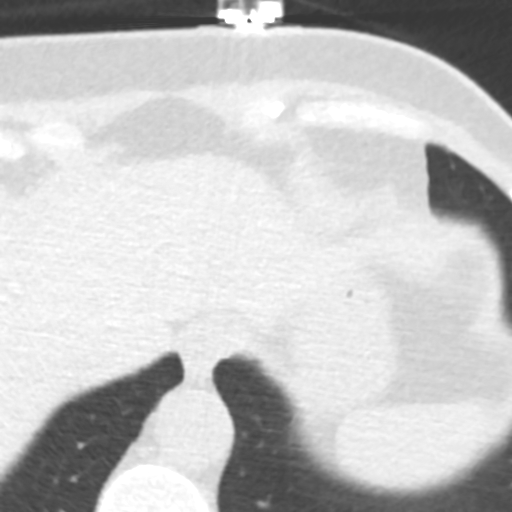
[im 10/43  vessel]
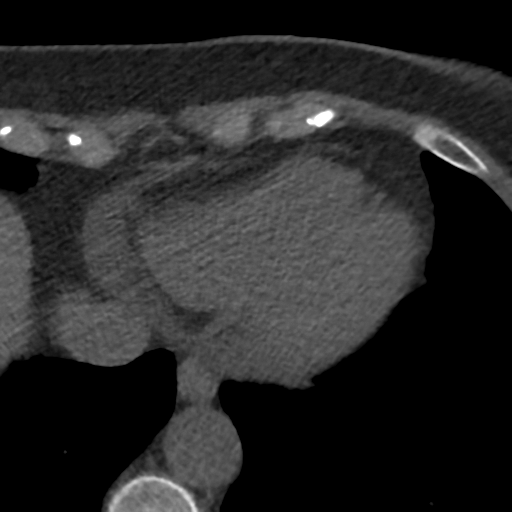
[im 15/43  vessel]
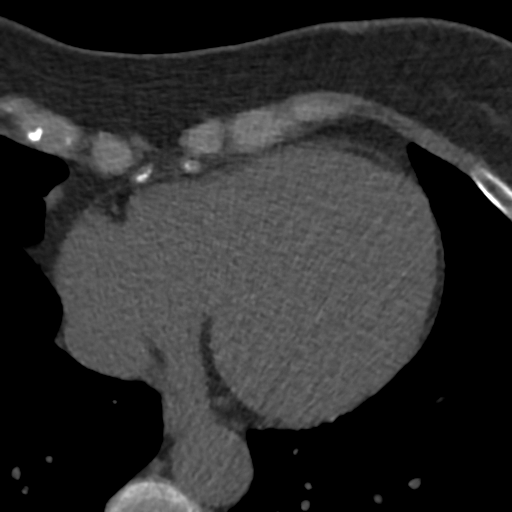
[im 19/43  vessel]
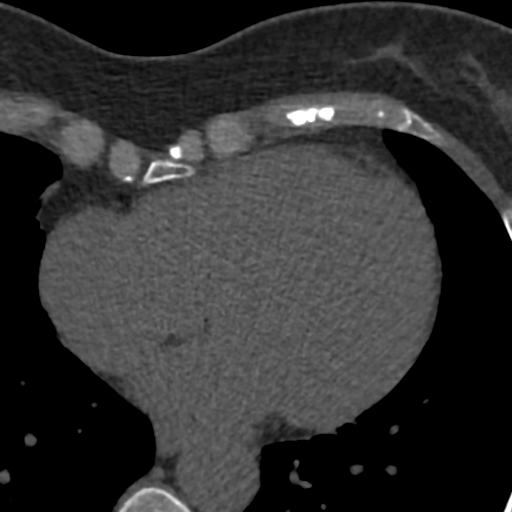
[im 24/43  vessel]
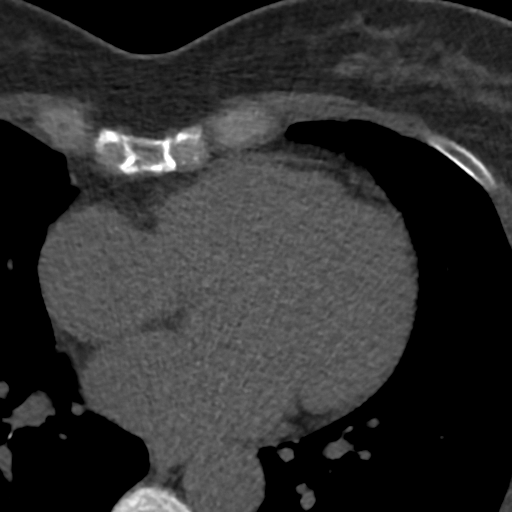
[im 24/43  lung]
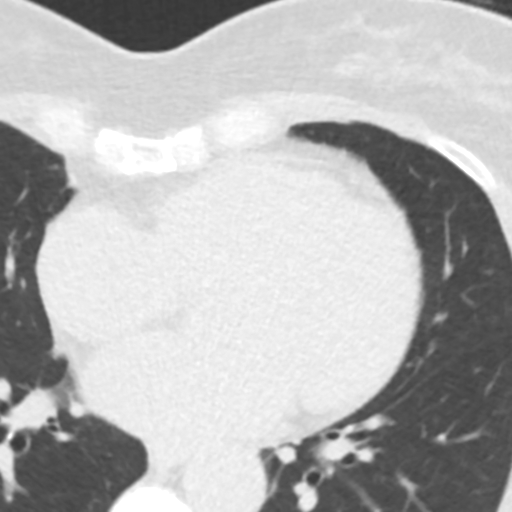
[im 29/43  vessel]
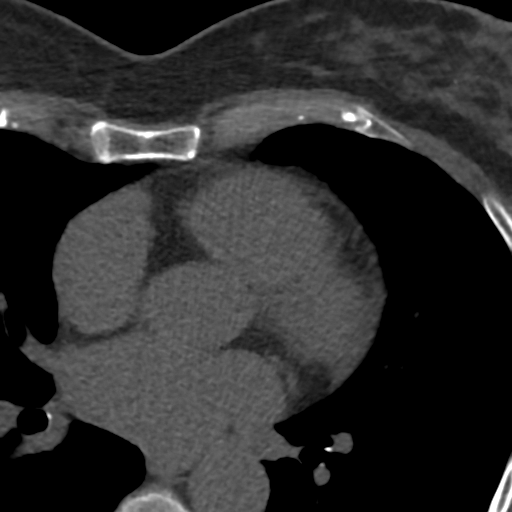
[im 33/43  vessel]
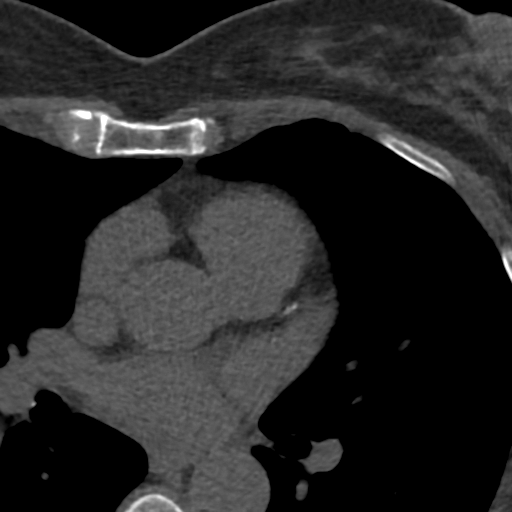
[im 38/43  vessel]
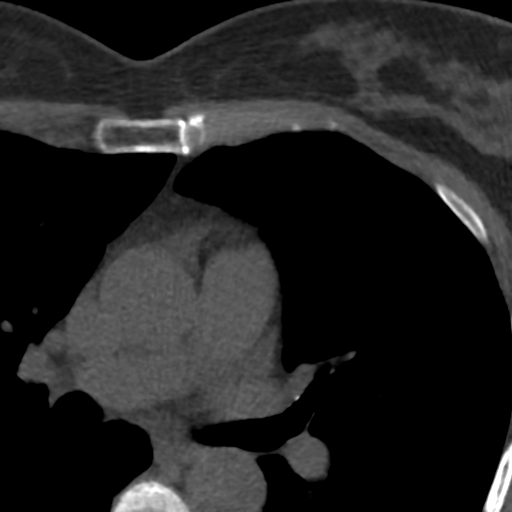

[Series 4: lung st 65 % · axial · 0.57mm/px · z∈[-222,-124]mm · 8 of 43 slices shown]
[im 5/43  lung]
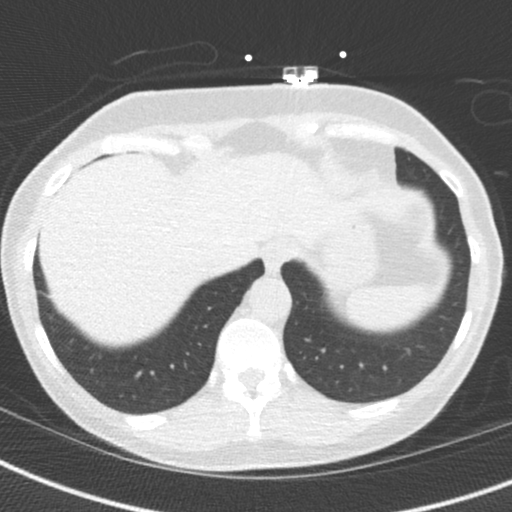
[im 10/43  lung]
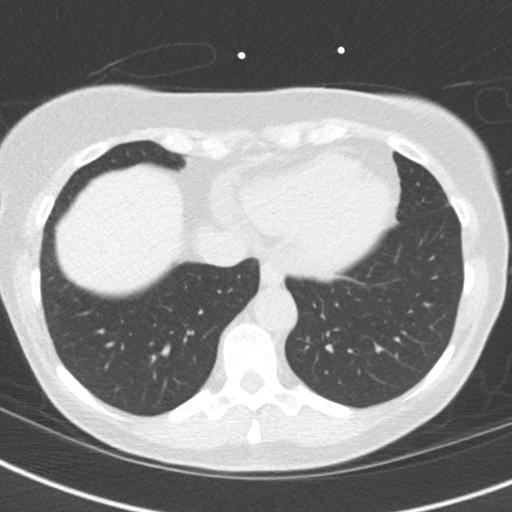
[im 15/43  lung]
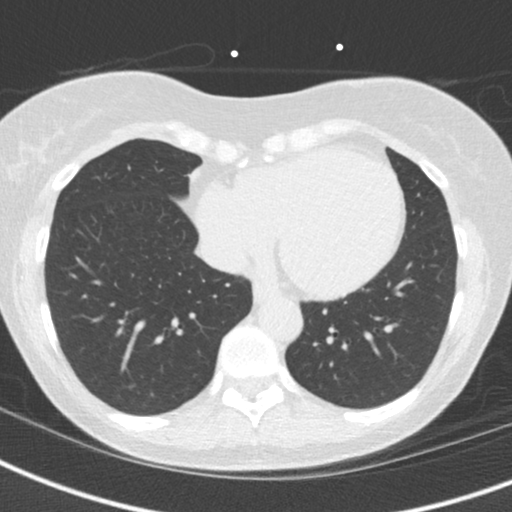
[im 19/43  lung]
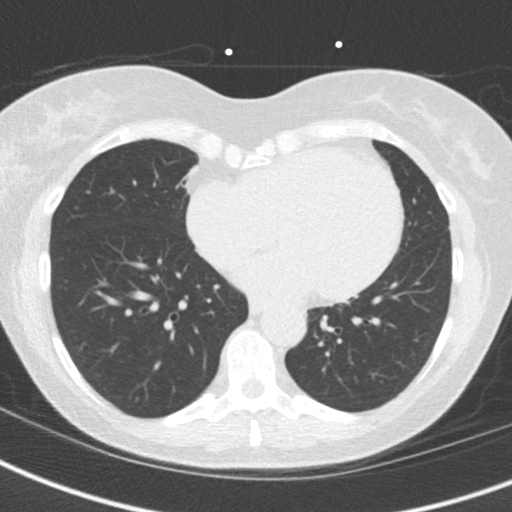
[im 24/43  lung]
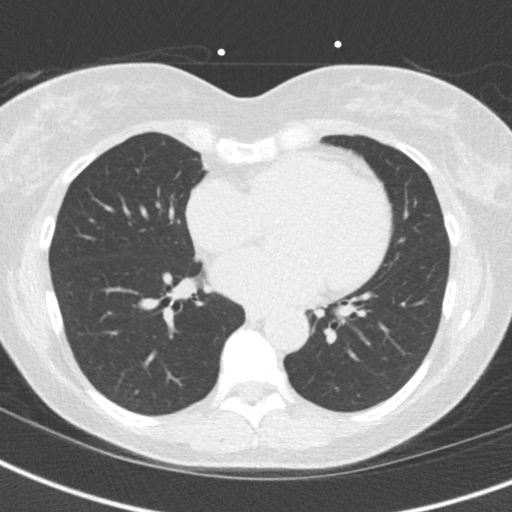
[im 29/43  lung]
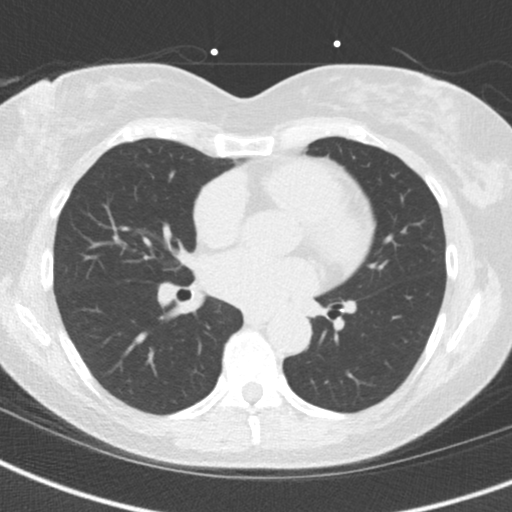
[im 33/43  lung]
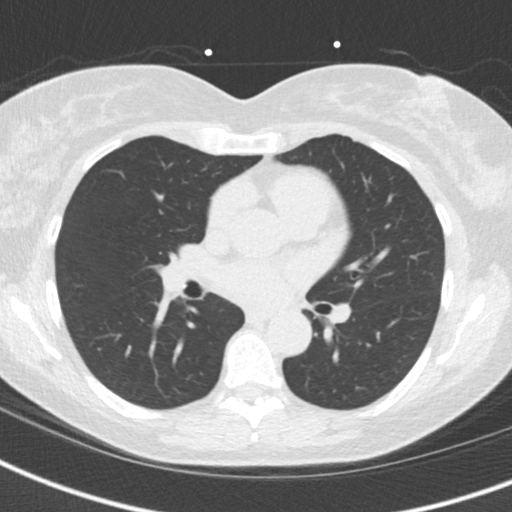
[im 38/43  lung]
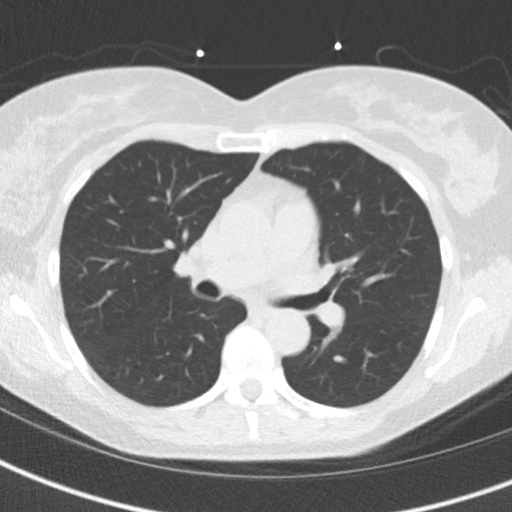

[16 of 20 positions shown; findings below may reference images not displayed]

FINDINGS: Non-cardiac: No significant non cardiac findings on limited lung and
soft tissue windows. See separate report from [REDACTED].

Ascending Aorta:  2.8 cm

Pericardium: Normal

Coronary arteries:  Mild calcium seen in proximal and mid LAD
IMPRESSION: Coronary calcium score of 53. This was 70th percentile for age and
sex matched control.

Gwgw Bostanci

EXAM:
OVER-READ INTERPRETATION  CT CHEST

The following report is an over-read performed by radiologist Dr.
Genaro Bones [REDACTED] on 02/01/2016. This over-read
does not include interpretation of cardiac or coronary anatomy or
pathology. The coronary calcium score/coronary CTA interpretation by
the cardiologist is attached.
FINDINGS: No adenopathy in the visualized mediastinum or hila. Visualized lung
fields are clear. No effusions. Visualized chest wall soft tissues
and upper abdomen unremarkable. No acute bony abnormality.
IMPRESSION: No acute or significant extracardiac abnormality.

## 2018-04-25 LAB — HM MAMMOGRAPHY

## 2018-04-29 DIAGNOSIS — H04123 Dry eye syndrome of bilateral lacrimal glands: Secondary | ICD-10-CM | POA: Diagnosis not present

## 2018-09-01 DIAGNOSIS — R42 Dizziness and giddiness: Secondary | ICD-10-CM | POA: Diagnosis not present

## 2018-09-01 DIAGNOSIS — R001 Bradycardia, unspecified: Secondary | ICD-10-CM | POA: Diagnosis not present

## 2018-10-29 DIAGNOSIS — H00014 Hordeolum externum left upper eyelid: Secondary | ICD-10-CM | POA: Diagnosis not present

## 2018-12-24 DIAGNOSIS — M545 Low back pain: Secondary | ICD-10-CM | POA: Diagnosis not present

## 2018-12-24 DIAGNOSIS — S59901A Unspecified injury of right elbow, initial encounter: Secondary | ICD-10-CM | POA: Diagnosis not present

## 2018-12-24 DIAGNOSIS — M546 Pain in thoracic spine: Secondary | ICD-10-CM | POA: Diagnosis not present

## 2018-12-24 DIAGNOSIS — S299XXA Unspecified injury of thorax, initial encounter: Secondary | ICD-10-CM | POA: Diagnosis not present

## 2018-12-24 DIAGNOSIS — R0789 Other chest pain: Secondary | ICD-10-CM | POA: Diagnosis not present

## 2018-12-24 DIAGNOSIS — M25521 Pain in right elbow: Secondary | ICD-10-CM | POA: Diagnosis not present

## 2018-12-24 DIAGNOSIS — R0781 Pleurodynia: Secondary | ICD-10-CM | POA: Diagnosis not present

## 2018-12-24 DIAGNOSIS — S29019A Strain of muscle and tendon of unspecified wall of thorax, initial encounter: Secondary | ICD-10-CM | POA: Diagnosis not present

## 2019-01-15 DIAGNOSIS — Z01419 Encounter for gynecological examination (general) (routine) without abnormal findings: Secondary | ICD-10-CM | POA: Diagnosis not present

## 2019-01-16 DIAGNOSIS — Z1231 Encounter for screening mammogram for malignant neoplasm of breast: Secondary | ICD-10-CM | POA: Diagnosis not present

## 2019-02-19 DIAGNOSIS — Z0001 Encounter for general adult medical examination with abnormal findings: Secondary | ICD-10-CM | POA: Diagnosis not present

## 2019-02-19 DIAGNOSIS — R5383 Other fatigue: Secondary | ICD-10-CM | POA: Diagnosis not present

## 2019-02-19 DIAGNOSIS — K219 Gastro-esophageal reflux disease without esophagitis: Secondary | ICD-10-CM | POA: Diagnosis not present

## 2019-02-19 DIAGNOSIS — Z23 Encounter for immunization: Secondary | ICD-10-CM | POA: Diagnosis not present

## 2019-02-19 DIAGNOSIS — Z6823 Body mass index (BMI) 23.0-23.9, adult: Secondary | ICD-10-CM | POA: Diagnosis not present

## 2019-02-19 DIAGNOSIS — E782 Mixed hyperlipidemia: Secondary | ICD-10-CM | POA: Diagnosis not present

## 2019-04-13 DIAGNOSIS — M8589 Other specified disorders of bone density and structure, multiple sites: Secondary | ICD-10-CM | POA: Diagnosis not present

## 2019-04-13 DIAGNOSIS — N959 Unspecified menopausal and perimenopausal disorder: Secondary | ICD-10-CM | POA: Diagnosis not present

## 2019-04-30 ENCOUNTER — Encounter: Payer: Self-pay | Admitting: Family Medicine

## 2019-05-07 ENCOUNTER — Other Ambulatory Visit: Payer: Self-pay | Admitting: Family Medicine

## 2019-05-14 ENCOUNTER — Ambulatory Visit: Payer: PPO | Attending: Internal Medicine

## 2019-05-14 ENCOUNTER — Ambulatory Visit: Payer: Self-pay

## 2019-05-14 DIAGNOSIS — Z23 Encounter for immunization: Secondary | ICD-10-CM | POA: Insufficient documentation

## 2019-05-14 NOTE — Progress Notes (Signed)
   Covid-19 Vaccination Clinic  Name:  Maria Allison    MRN: [id number] DOB: Oct 05, 1950  05/14/2019  Maria Allison was observed post Covid-19 immunization for 15 minutes without incident. She was provided with Vaccine Information Sheet and instruction to access the V-Safe system.   Maria Allison was instructed to call 911 with any severe reactions post vaccine: Marland Kitchen Difficulty breathing  . Swelling of face and throat  . A fast heartbeat  . A bad rash all over body  . Dizziness and weakness

## 2019-05-21 DIAGNOSIS — D1801 Hemangioma of skin and subcutaneous tissue: Secondary | ICD-10-CM | POA: Diagnosis not present

## 2019-05-21 DIAGNOSIS — L814 Other melanin hyperpigmentation: Secondary | ICD-10-CM | POA: Diagnosis not present

## 2019-05-21 DIAGNOSIS — L821 Other seborrheic keratosis: Secondary | ICD-10-CM | POA: Diagnosis not present

## 2019-06-09 ENCOUNTER — Ambulatory Visit: Payer: PPO | Attending: Internal Medicine

## 2019-06-09 DIAGNOSIS — Z23 Encounter for immunization: Secondary | ICD-10-CM

## 2019-06-09 NOTE — Progress Notes (Signed)
   Covid-19 Vaccination Clinic  Name:  Maria Allison    MRN: [id number] DOB: [birth year]  06/09/2019  [name] was observed post Covid-19 immunization for 15 minutes without incident. She was provided with Vaccine Information Sheet and instruction to access the V-Safe system.   [name] was instructed to call 911 with any severe reactions post vaccine: Marland Kitchen Difficulty breathing  . Swelling of face and throat  . A fast heartbeat  . A bad rash all over body  . Dizziness and weakness   Immunizations Administered    Name Date Dose VIS Date Route   Pfizer COVID-19 Vaccine 06/09/2019  3:43 PM 0.3 mL 02/20/2019 Intramuscular   Manufacturer: [place]   Lot: [id number]   [place]: [id number]

## 2019-06-10 ENCOUNTER — Ambulatory Visit: Payer: PPO

## 2019-06-10 ENCOUNTER — Telehealth: Payer: Self-pay | Admitting: Legal Medicine

## 2019-06-10 NOTE — Progress Notes (Signed)
  Chronic Care Management   Outreach Note  06/10/2019 Name: Maria Allison MRN: [id number] DOB: 09/23/1950  Referred by: Lillard Anes, MD Reason for referral : No chief complaint on file.   An unsuccessful telephone outreach was attempted today. The patient was referred to the pharmacist for assistance with care management and care coordination.   Follow Up Plan:   Earney Hamburg Upstream Scheduler

## 2019-07-04 ENCOUNTER — Other Ambulatory Visit: Payer: Self-pay | Admitting: Family Medicine

## 2019-07-06 ENCOUNTER — Other Ambulatory Visit: Payer: Self-pay | Admitting: Family Medicine

## 2019-07-06 NOTE — Telephone Encounter (Signed)
Your pt

## 2019-08-15 ENCOUNTER — Ambulatory Visit (INDEPENDENT_AMBULATORY_CARE_PROVIDER_SITE_OTHER)
Admission: RE | Admit: 2019-08-15 | Discharge: 2019-08-15 | Disposition: A | Discharge status: Home / Self Care | Payer: PPO | Source: Home / Self Care

## 2019-08-15 DIAGNOSIS — H5789 Other specified disorders of eye and adnexa: Secondary | ICD-10-CM | POA: Diagnosis not present

## 2019-08-15 MED ORDER — PREDNISONE 20 MG PO TABS: ORAL_TABLET | ORAL | 0 refills | Status: DC

## 2019-08-15 MED ORDER — NEOMYCIN-POLYMYXIN-HC 3.5-10000-1 OP SUSP: 3.0000 [drp] | Freq: Four times a day (QID) | OPHTHALMIC | 0 refills | Status: DC

## 2019-08-15 MED ORDER — NEOMYCIN-POLYMYXIN-HC 3.5-10000-1 OP SUSP: 3.0000 [drp] | Freq: Three times a day (TID) | OPHTHALMIC | 0 refills | Status: AC

## 2019-08-15 NOTE — ED Provider Notes (Signed)
°  [facility] VIDEO VISITS    CSN: [id number] Arrival date & time: 08/15/19  1056      History   Chief Complaint No chief complaint on file.   HPI Maria Allison is a 69 y.o. female.   HPI   Virtual Visit via Video Note  I connected with [name] on 08/15/19 at 11:00 AM EDT by a video enabled telemedicine application and verified that I am speaking with the correct person using two identifiers.  Location: Patient: Home  Provider: [facility]   I discussed the limitations of evaluation and management by telemedicine and the availability of in person appointments. The patient expressed understanding and agreed to proceed.  History of Present Illness: Patient presents for evaluation bilateral eye itching with upper and lower lid irritation. Problem present for approximately 2 weeks. She has applied multiple OTC eye medication and hemorrhoid ointment for eyelid itching which temporarily improved problem, however, eyelid itching and burning recurred. She has had a similar problem last year which improved after a course of eye antibiotics. She is unable to identify the source of irritation. She has visit scheduled with her PCP in 5 days. Endorses lid heaviness due to swelling and irritation. She had some mild changes in vision. She able to open and close her eye without issue. She is applying warm compresses to eyes daily.   Observations/Objective: Bilateral upper eye lid erythema with mild swelling. Moderate swelling lower lids. Sclera normal. Ocular movement normal. No discharge seen per video.  Assessment and Plan: Discussed limitation of exam given done via video. Given appearance of eye lids, suspect a contact dermatitis involving the bilateral eye lids. Will trial treatment with oral prednisone, antibiotic eye drops, and continue warm compresses. Advised to discontinue any other medication or topicals.   Follow Up Instructions: PCP as scheduled. Ophthalmologist if no  improvement 2 days ER if worsens.     Scot Jun, FNP 08/15/19 [phone]

## 2019-08-18 NOTE — Progress Notes (Signed)
Acute Office Visit  Subjective:    Patient ID: Maria Allison, female    DOB: 04-Jul-1950, 68 y.o.   MRN: [id number]  Chief Complaint  Patient presents with  . eye redness    HPI Patient is in today for Follow up of periorbital edema on Saturday. Did virtual visit on Sunday and her eyes swelled up and became itchy. She does not know what caused it. She received prednisone 20 mg taper (60/40/20), neomycin drops.  Takes allegra daily. No other rashes.   Past Medical History:  Diagnosis Date  . Chest pain   . Depression   . GERD (gastroesophageal reflux disease)   . Hyperlipemia   . Osteoporosis     Past Surgical History:  Procedure Laterality Date  . CARDIAC CATHETERIZATION    . LUMBAR DISC SURGERY    . TONSILLECTOMY      Family History  Problem Relation Age of Onset  . Thrombosis Unknown        There is a family history of portal vein thrombosis for which family screening has been recommended at [facility] History   Socioeconomic History  . Marital status: Married    Spouse name: Not on file  . Number of children: Not on file  . Years of education: Not on file  . Highest education level: Not on file  Occupational History  . Not on file  Tobacco Use  . Smoking status: Never Smoker  . Smokeless tobacco: Never Used  Substance and Sexual Activity  . Alcohol use: Yes  . Drug use: No  . Sexual activity: Not on file  Other Topics Concern  . Not on file  Social History Narrative   The patient lives in [place] with her husband.    She has one daughter who is in college at [employer].    She admits to just occasional alcohol use.  She denies any tobacco    abuse.         Social Determinants of Health   Financial Resource Strain:   . Difficulty of Paying Living Expenses:   Food Insecurity:   . Worried About Charity fundraiser in the Last Year:   . Arboriculturist in the Last Year:   Transportation Needs:   . Film/video editor  (Medical):   Marland Kitchen Lack of Transportation (Non-Medical):   Physical Activity:   . Days of Exercise per Week:   . Minutes of Exercise per Session:   Stress:   . Feeling of Stress :   Social Connections:   . Frequency of Communication with Friends and Family:   . Frequency of Social Gatherings with Friends and Family:   . Attends Religious Services:   . Active Member of Clubs or Organizations:   . Attends Archivist Meetings:   Marland Kitchen Marital Status:   Intimate Partner Violence:   . Fear of Current or Ex-Partner:   . Emotionally Abused:   Marland Kitchen Physically Abused:   . Sexually Abused:     Outpatient Medications Prior to Visit  Medication Sig Dispense Refill  . aspirin 81 MG tablet Take 81 mg by mouth daily.    . cetirizine (ZYRTEC) 10 MG tablet Take 10 mg by mouth daily.    . Cholecalciferol (VITAMIN D) 2000 units CAPS Take 1 capsule by mouth daily.    . Coenzyme Q10 (COQ-10 PO) Take 1 tablet by mouth daily.    . Fish Oil OIL Take 1  tablet by mouth daily.    . Multiple Vitamins-Minerals (MULTIVITAMIN ADULT PO) Take 1 tablet by mouth daily.    Marland Kitchen neomycin-polymyxin-hydrocortisone (CORTISPORIN) 3.5-10000-1 ophthalmic suspension Place 3 drops into both eyes 3 (three) times daily for 5 days. 2.3 mL 0  . omeprazole (PRILOSEC) 40 MG capsule TAKE 1 CAPSULE BY MOUTH DAILY 90 capsule 2  . predniSONE (DELTASONE) 20 MG tablet Take 3 PO QAM x3days, 2 PO QAM x3days, 1 PO QAM x3days 18 tablet 0  . rosuvastatin (CRESTOR) 20 MG tablet TAKE 1 TABLET(20 MG) BY MOUTH EVERY DAY 30 tablet 2  . sertraline (ZOLOFT) 50 MG tablet TAKE 1 TABLET BY MOUTH EVERY DAY 90 tablet 0  . TURMERIC CURCUMIN PO Take by mouth.    . Probiotic Product (PROBIOTIC PO) Take 1 tablet by mouth daily.    . rosuvastatin (CRESTOR) 10 MG tablet Take 1 tablet (10 mg total) by mouth daily. Please call and schedule an appt for further refills, 1st attempt 30 tablet 0  . Oxymetazoline HCl (NASAL SPRAY NA) Place into the nose as needed.      . sertraline (ZOLOFT) 25 MG tablet Take 25 mg by mouth every other day.     No facility-administered medications prior to visit.    No Known Allergies  Review of Systems  Constitutional: Positive for fatigue (Last 3 days, but improving. ). Negative for chills and fever.  HENT: Positive for rhinorrhea. Negative for congestion, ear pain, postnasal drip, sinus pressure, sinus pain and sore throat.   Eyes: Positive for pain, discharge, redness and itching.  Respiratory: Negative for cough and shortness of breath.   Cardiovascular: Negative for chest pain.  Musculoskeletal: Positive for arthralgias. Back pain: right wrist pain   Neurological: Negative for dizziness and headaches.       Objective:    Physical Exam Vitals reviewed.  Constitutional:      Appearance: Normal appearance. She is normal weight.  HENT:     Mouth/Throat:     Pharynx: Oropharynx is clear.  Eyes:     Comments: Eyes appear normal. Periorbital edema resolved. I viewed photos.   Cardiovascular:     Rate and Rhythm: Normal rate and regular rhythm.     Pulses: Normal pulses.     Heart sounds: Normal heart sounds. No murmur.  Pulmonary:     Effort: Pulmonary effort is normal. No respiratory distress.     Breath sounds: Normal breath sounds.  Neurological:     Mental Status: She is alert and oriented to person, place, and time.  Psychiatric:        Mood and Affect: Mood normal.        Behavior: Behavior normal.     BP 132/70   Pulse 72   Temp (!) 97.5 F (36.4 C)   Resp 14   Ht 5\' 1"  (1.549 m)   Wt 122 lb 6.4 oz (55.5 kg)   BMI 23.13 kg/m  Wt Readings from Last 3 Encounters:  08/19/19 122 lb 6.4 oz (55.5 kg)  06/26/16 117 lb 12.8 oz (53.4 kg)  09/26/11 119 lb (54 kg)    Health Maintenance Due  Topic Date Due  . Hepatitis C Screening  Never done  . TETANUS/TDAP  Never done  . COLONOSCOPY  Never done  . MAMMOGRAM  09/05/2014  . PNA vac Low Risk Adult (1 of 2 - PCV13) Never done    There  are no preventive care reminders to display for this patient.   No results  found for: TSH Lab Results  Component Value Date   WBC 5.3 02/28/2009   HGB 13.1 02/28/2009   HCT 37.7 02/28/2009   MCV 91.4 02/28/2009   PLT 220 02/28/2009   Lab Results  Component Value Date   NA 139 06/27/2008   K 3.7 06/27/2008   CO2 25 06/27/2008   GLUCOSE 148 (H) 06/27/2008   BUN 11 06/27/2008   CREATININE 0.75 06/27/2008   BILITOT 0.6 02/15/2016   ALKPHOS 61 02/15/2016   AST 19 02/15/2016   ALT 14 02/15/2016   PROT 6.7 02/15/2016   ALBUMIN 4.4 02/15/2016   CALCIUM 8.9 06/27/2008   Lab Results  Component Value Date   CHOL 260 (H) 05/16/2016   Lab Results  Component Value Date   HDL 102 05/16/2016   Lab Results  Component Value Date   LDLCALC 143 (H) 05/16/2016   Lab Results  Component Value Date   TRIG 76 05/16/2016   Lab Results  Component Value Date   CHOLHDL 2.5 05/16/2016   No results found for: HGBA1C   Assessment/Plan: 1. Periorbital swelling - Ambulatory referral to Allergy Complete prednisone. Continue allegra.  Follow-up: Return in about 6 months (around 02/23/2020) for Annual well exam with Dr. Tobie Poet fasting.Marland Kitchen  An After Visit Summary was printed and given to the patient.  Rochel Brome [facility] [phone]

## 2019-08-19 ENCOUNTER — Encounter: Payer: Self-pay | Admitting: Family Medicine

## 2019-08-19 ENCOUNTER — Telehealth: Payer: Self-pay | Admitting: Family Medicine

## 2019-08-19 ENCOUNTER — Ambulatory Visit (INDEPENDENT_AMBULATORY_CARE_PROVIDER_SITE_OTHER): Payer: PPO | Admitting: Family Medicine

## 2019-08-19 ENCOUNTER — Other Ambulatory Visit: Payer: Self-pay

## 2019-08-19 VITALS — BP 132/70 | HR 72 | Temp 97.5°F | Resp 14 | Ht 61.0 in | Wt 122.4 lb

## 2019-08-19 DIAGNOSIS — H5789 Other specified disorders of eye and adnexa: Secondary | ICD-10-CM | POA: Diagnosis not present

## 2019-08-19 NOTE — Progress Notes (Signed)
°  Chronic Care Management   Outreach Note  08/19/2019 Name: Maria Allison MRN: [id number] DOB: 11-Oct-1950  Referred by: Rochel Brome, MD Reason for referral : No chief complaint on file.   An unsuccessful telephone outreach was attempted today. The patient was referred to the pharmacist for assistance with care management and care coordination.   This note is not being shared with the patient for the following reason: To respect privacy (The patient or proxy has requested that the information not be shared).  Follow Up Plan:   Earney Hamburg Upstream Scheduler

## 2019-08-19 NOTE — Progress Notes (Signed)
°  Chronic Care Management   Note  08/19/2019 Name: KIMEKA BADOUR MRN: [id number] DOB: December 22, 1950  MAYERLI KIRST is a 69 y.o. year old female who is a primary care patient of Cox, Kirsten, MD. I reached out to [name] by phone today in response to a referral sent by Ms. Alda Ponder Brandes's PCP, Cox, Kirsten, MD.   Ms. Klassen was given information about Chronic Care Management services today including:  1. CCM service includes personalized support from designated clinical staff supervised by her physician, including individualized plan of care and coordination with other care providers 2. 24/7 contact phone numbers for assistance for urgent and routine care needs. 3. Service will only be billed when office clinical staff spend 20 minutes or more in a month to coordinate care. 4. Only one practitioner may furnish and bill the service in a calendar month. 5. The patient may stop CCM services at any time (effective at the end of the month) by phone call to the office staff.   Patient agreed to services and verbal consent obtained.   This note is not being shared with the patient for the following reason: To respect privacy (The patient or proxy has requested that the information not be shared).  Follow up plan:   Earney Hamburg Upstream Scheduler

## 2019-09-07 ENCOUNTER — Encounter: Payer: Self-pay | Admitting: Allergy and Immunology

## 2019-09-07 ENCOUNTER — Ambulatory Visit (INDEPENDENT_AMBULATORY_CARE_PROVIDER_SITE_OTHER): Payer: PPO | Admitting: Allergy and Immunology

## 2019-09-07 ENCOUNTER — Other Ambulatory Visit: Payer: Self-pay

## 2019-09-07 VITALS — BP 110/58 | HR 68 | Resp 8 | Ht 60.3 in | Wt 122.6 lb

## 2019-09-07 DIAGNOSIS — H1010 Acute atopic conjunctivitis, unspecified eye: Secondary | ICD-10-CM | POA: Diagnosis not present

## 2019-09-07 DIAGNOSIS — J301 Allergic rhinitis due to pollen: Secondary | ICD-10-CM | POA: Diagnosis not present

## 2019-09-07 DIAGNOSIS — J3089 Other allergic rhinitis: Secondary | ICD-10-CM | POA: Diagnosis not present

## 2019-09-07 DIAGNOSIS — T781XXD Other adverse food reactions, not elsewhere classified, subsequent encounter: Secondary | ICD-10-CM

## 2019-09-07 MED ORDER — MONTELUKAST SODIUM 10 MG PO TABS: 10.0000 mg | ORAL_TABLET | Freq: Every day | ORAL | 5 refills | Status: DC

## 2019-09-07 MED ORDER — DESONIDE 0.05 % EX OINT: TOPICAL_OINTMENT | CUTANEOUS | 5 refills | Status: DC

## 2019-09-07 MED ORDER — OMEPRAZOLE 40 MG PO CPDR: 40.0000 mg | DELAYED_RELEASE_CAPSULE | Freq: Every day | ORAL | 1 refills | Status: DC

## 2019-09-07 MED ORDER — EPINEPHRINE 0.3 MG/0.3ML IJ SOAJ: INTRAMUSCULAR | 3 refills | Status: AC

## 2019-09-07 MED ORDER — OLOPATADINE HCL 0.6 % NA SOLN: NASAL | 5 refills | Status: DC

## 2019-09-07 NOTE — Progress Notes (Signed)
[place] - [place] - [place] - [place] - [place]   Dear Dr. Tobie Poet,  Thank you for referring Maria Allison to the [facility] on 09/07/2019.   Below is a summation of this patient's evaluation and recommendations.  Thank you for your referral. I will keep you informed about this patient's response to treatment.   If you have any questions please do not hesitate to contact me.   Sincerely,  Jiles Prows, MD Allergy / Immunology [place]   ______________________________________________________________________    NEW PATIENT NOTE  Referring Provider: Rochel Brome, MD Primary Provider: Rochel Brome, MD Date of office visit: 09/07/2019    Subjective:   Chief Complaint:  Maria Allison (DOB: 1950-07-27) is a 69 y.o. female who presents to the clinic on 09/07/2019 with a chief complaint of Eye Problem .     HPI: Tannia presents to this clinic in evaluation of allergies. Apparently I had seen her in this clinic decades ago for an issue with allergic rhinitis.  She has a long history of sneezing and nose blowing and some mild itchy watery eyes that may have had some degree of seasonality and may occurr more often with outdoor exposure but was certainly a manageable issue for many years.  She has noticed that over the course of the past several years the intensity of her upper airway symptoms and especially her eye symptoms have increased dramatically. She has developed episodes of extremely red swollen itchy eyes lids that will end up scaling. She has apparently required prednisone in May 2020 and May 2021 for these episodes of eye swelling. She does not really note an obvious provoking factor that gives rise to this issue. However, she was working with Oncologist around the time that her eyelid issue developed in May 2021.  She has also developed a progressive  problem with eating fresh fruit and tree nuts. She will developed very significant mouth itching and swelling and has developed an episode of throat swelling when eating a peach.  If these food products are cooked she can eat them with no problem.  She does have a history of dry eye syndrome and uses over-the-counter wetting solutions.  She has a history of reflux that is still somewhat active while using omeprazole about 4 times per week that originally presented as a chest pain syndrome about 10 years ago. She has apparently had an upper endoscopy which identified esophagitis.  She has received 2 Moderna Covid vaccinations.  Past Medical History:  Diagnosis Date  . Chest pain   . Depression   . GERD (gastroesophageal reflux disease)   . Hyperlipemia   . Osteoporosis     Past Surgical History:  Procedure Laterality Date  . ABDOMINAL EXPLORATION SURGERY    . BREAST BIOPSY  1997  . CARDIAC CATHETERIZATION    . LUMBAR DISC SURGERY    . TONSILLECTOMY      Allergies as of 09/07/2019   No Known Allergies     Medication List    aspirin 81 MG tablet Take 81 mg by mouth daily.   CALCIUM-VITAMIN D3 PO Take by mouth daily.   cetirizine 10 MG tablet Commonly known as: ZYRTEC Take 10 mg by mouth daily.   COQ-10 PO Take 1 tablet by mouth daily.   EYE VITAMINS PO Take by mouth.   Fish Oil Oil Take 1 tablet by mouth daily.  omeprazole 40 MG capsule Commonly known as: PRILOSEC TAKE 1 CAPSULE BY MOUTH DAILY   rosuvastatin 20 MG tablet Commonly known as: CRESTOR TAKE 1 TABLET(20 MG) BY MOUTH EVERY DAY   sertraline 50 MG tablet Commonly known as: ZOLOFT TAKE 1 TABLET BY MOUTH EVERY DAY       Review of systems negative except as noted in HPI / PMHx or noted below:  Review of Systems  Constitutional: Negative.   HENT: Negative.   Eyes: Negative.   Respiratory: Negative.   Cardiovascular: Negative.   Gastrointestinal: Negative.   Genitourinary: Negative.     Musculoskeletal: Negative.   Skin: Negative.   Neurological: Negative.   Endo/Heme/Allergies: Negative.   Psychiatric/Behavioral: Negative.     Family History  Problem Relation Age of Onset  . Thrombosis Other        There is a family history of portal vein thrombosis for which family screening has been recommended at [facility]  . Leukemia Mother   . Heart disease Father   . Kidney cancer Father   . Heart disease Maternal Grandmother   . Ulcerative colitis Maternal Grandfather   . Lung cancer Paternal Grandmother   . Liver cancer Paternal Grandmother   . Heart disease Brother   . Heart attack Brother     Social History   Socioeconomic History  . Marital status: Married    Spouse name: Not on file  . Number of children: Not on file  . Years of education: Not on file  . Highest education level: Not on file  Occupational History  . Not on file  Tobacco Use  . Smoking status: Never Smoker  . Smokeless tobacco: Never Used  Substance and Sexual Activity  . Alcohol use: Yes  . Drug use: No  . Sexual activity: Not on file  Other Topics Concern  . Not on file  Social History Narrative   The patient lives in [place] with her husband.    She has one daughter who is in college at [employer].    She admits to just occasional alcohol use.  She denies any tobacco    abuse.          Environmental and Social history  Lives in a house with a dry environment, cat located inside the household, no carpet in the bedroom, plastic on the bed, plastic on the pillow, no smoking ongoing with inside the household.  Objective:   Vitals:   09/07/19 1435  BP: (!) 110/58  Pulse: 68  Resp: (!) 8  SpO2: 97%   Height: 5' 0.3" (153.2 cm) Weight: 122 lb 9.6 oz (55.6 kg)  Physical Exam Constitutional:      Appearance: She is not diaphoretic.  HENT:     Head: Normocephalic. No right periorbital erythema or left periorbital erythema.     Right Ear: Tympanic membrane,  ear canal and external ear normal.     Left Ear: Tympanic membrane, ear canal and external ear normal.     Nose: Nose normal. No mucosal edema or rhinorrhea.     Mouth/Throat:     Pharynx: Uvula midline. No oropharyngeal exudate.  Eyes:     General: Lids are normal.     Conjunctiva/sclera: Conjunctivae normal.     Pupils: Pupils are equal, round, and reactive to light.  Neck:     Thyroid: No thyromegaly.     Trachea: Trachea normal. No tracheal tenderness or tracheal deviation.  Cardiovascular:     Rate and Rhythm: Normal  rate and regular rhythm.     Heart sounds: Normal heart sounds, S1 normal and S2 normal. No murmur heard.   Pulmonary:     Effort: Pulmonary effort is normal. No respiratory distress.     Breath sounds: Normal breath sounds. No stridor. No wheezing or rales.  Chest:     Chest wall: No tenderness.  Abdominal:     General: There is no distension.     Palpations: Abdomen is soft. There is no mass.     Tenderness: There is no abdominal tenderness. There is no guarding or rebound.  Musculoskeletal:        General: No tenderness.  Lymphadenopathy:     Head:     Right side of head: No tonsillar adenopathy.     Left side of head: No tonsillar adenopathy.     Cervical: No cervical adenopathy.  Skin:    Coloration: Skin is not pale.     Findings: No erythema or rash.     Nails: There is no clubbing.  Neurological:     Mental Status: She is alert.     Diagnostics: Allergy skin tests were performed.  She demonstrated hypersensitivity against tree pollen, cats, and dust mite.  She also demonstrated hypersensitivity to soybean, almond, hazelnut, pistachio, hops, carrots, celery  Assessment and Plan:    1. Perennial allergic rhinitis   2. Seasonal allergic rhinitis due to pollen   3. Seasonal allergic conjunctivitis   4. Pollen-food allergy, subsequent encounter     1. Allergen avoidance measures - pollen, cat, dust mite. Sports glasses  2. Treat and prevent  inflammation:   A. Montelukast 10 mg - 1 tablet 1 time per day  B. OTC Nasacort  / Rhinocort - 1 spray each nostril 3-7 times per week  3. Treat and prevent dry eye syndrome:   A. Avoid oral antihistamines  B. OTC Systane gel drops and regular drops  Treat and prevent reflux:   A. Consolidate caffeine and chocolate  B. Omeprazole 40 mg - 1 tablet 1 time per day  4. If needed:   A. Nasal saline  B. Patanase - 2 sprays each nostril 1-2 times per day  C. Desonide ointment to eyelids 1 time per day  D. Epi-Pen, benadryl, MD/ER evaluation for allergic reaction.  5. 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  We will treat her with anti-inflammatory agents on a consistent basis in a preventative mode while she attempts to perform allergen avoidance measures and we will also address her issue of reflux and her oral allergy syndrome.  She would definitely be a candidate for immunotherapy should she fail medical treatment.  I will see her back in this clinic in 4 weeks or earlier if there is a problem.  Jiles Prows, MD Allergy / Immunology [facility]

## 2019-09-07 NOTE — Patient Instructions (Addendum)
  1. Allergen avoidance measures - pollen, cat, dust mite. Sports glasses  2. Treat and prevent inflammation:   A. Montelukast 10 mg - 1 tablet 1 time per day  B. OTC Nasacort  / Rhinocort - 1 spray each nostril 3-7 times per week  3. Treat and prevent dry eye syndrome:   A. Avoid oral antihistamines  B. OTC Systane gel drops and regular drops  Treat and prevent reflux:   A. Consolidate caffeine and chocolate  B. Omeprazole 40 mg - 1 tablet 1 time per day  4. If needed:   A. Nasal saline  B. Patanase - 2 sprays each nostril 1-2 times per day  C. Desonide ointment to eyelids 1 time per day  D. Epi-Pen, benadryl, MD/ER evaluation for allergic reaction.  5. Return to clinic in 4 weeks or earlier if problem

## 2019-09-08 ENCOUNTER — Encounter: Payer: Self-pay | Admitting: Allergy and Immunology

## 2019-09-23 ENCOUNTER — Ambulatory Visit: Payer: Self-pay | Admitting: Allergy and Immunology

## 2019-10-05 ENCOUNTER — Ambulatory Visit: Payer: PPO | Admitting: Allergy and Immunology

## 2019-10-05 ENCOUNTER — Encounter: Payer: Self-pay | Admitting: Allergy and Immunology

## 2019-10-05 ENCOUNTER — Other Ambulatory Visit: Payer: Self-pay | Admitting: Family Medicine

## 2019-10-05 ENCOUNTER — Other Ambulatory Visit: Payer: Self-pay

## 2019-10-05 VITALS — BP 124/64 | HR 58 | Resp 16

## 2019-10-05 DIAGNOSIS — T781XXD Other adverse food reactions, not elsewhere classified, subsequent encounter: Secondary | ICD-10-CM | POA: Diagnosis not present

## 2019-10-05 DIAGNOSIS — J301 Allergic rhinitis due to pollen: Secondary | ICD-10-CM | POA: Diagnosis not present

## 2019-10-05 DIAGNOSIS — H1010 Acute atopic conjunctivitis, unspecified eye: Secondary | ICD-10-CM

## 2019-10-05 DIAGNOSIS — K219 Gastro-esophageal reflux disease without esophagitis: Secondary | ICD-10-CM | POA: Diagnosis not present

## 2019-10-05 DIAGNOSIS — J3089 Other allergic rhinitis: Secondary | ICD-10-CM | POA: Diagnosis not present

## 2019-10-05 NOTE — Progress Notes (Signed)
- [place] - [place]   Follow-up Note  Referring Provider: Rochel Brome, MD Primary Provider: Rochel Brome, MD Date of Office Visit: 10/05/2019  Subjective:   Maria Allison (DOB: 1951/02/05) is a 69 y.o. female who returns to the [facility] on 10/05/2019 in re-evaluation of the following:  HPI: Maria Allison returns to this clinic in evaluation of allergic rhinoconjunctivitis, dry eye syndrome, oral allergy syndrome, and reflux addressed during her initial evaluation of 07 September 2019.  She is doing much better with her eyes.  She has very little problems with eye irritation and she has not developed any eczema on her eyelids.  She has been using montelukast on a consistent basis and a nasal steroid a few times per week.  She has not had to use any desonide.  She avoids all oral antihistamine use and is using some OTC Systane eyedrops.  She has had very little problems with reflux while utilizing omeprazole.  Allergies as of 10/05/2019   No Known Allergies     Medication List    aspirin 81 MG tablet Take 81 mg by mouth daily.   CALCIUM-VITAMIN D3 PO Take by mouth daily.   COQ-10 PO Take 1 tablet by mouth daily.   desonide 0.05 % ointment Commonly known as: DESOWEN Can apply to eyelids once daily if needed.   EPINEPHrine 0.3 mg/0.3 mL Soaj injection Commonly known as: EPI-PEN Use as directed for life-threatening allergic reaction.   EYE VITAMINS PO Take by mouth.   Fish Oil Oil Take 1 tablet by mouth daily.   montelukast 10 MG tablet Commonly known as: SINGULAIR Take 1 tablet (10 mg total) by mouth at bedtime.   Olopatadine HCl 0.6 % Soln Can use two sprays in each nostril one to two times daily if needed.   omeprazole 40 MG capsule Commonly known as: PRILOSEC Take 1 capsule (40 mg total) by mouth daily.   rosuvastatin 20 MG tablet Commonly known as: CRESTOR TAKE 1 TABLET(20 MG) BY MOUTH EVERY DAY     sertraline 50 MG tablet Commonly known as: ZOLOFT TAKE 1 TABLET BY MOUTH EVERY DAY       Past Medical History:  Diagnosis Date  . Chest pain   . Depression   . GERD (gastroesophageal reflux disease)   . Hyperlipemia   . Osteoporosis     Past Surgical History:  Procedure Laterality Date  . ABDOMINAL EXPLORATION SURGERY    . BREAST BIOPSY  1997  . CARDIAC CATHETERIZATION    . LUMBAR DISC SURGERY    . TONSILLECTOMY      Review of systems negative except as noted in HPI / PMHx or noted below:  Review of Systems  Constitutional: Negative.   HENT: Negative.   Eyes: Negative.   Respiratory: Negative.   Cardiovascular: Negative.   Gastrointestinal: Negative.   Genitourinary: Negative.   Musculoskeletal: Negative.   Skin: Negative.   Neurological: Negative.   Endo/Heme/Allergies: Negative.   Psychiatric/Behavioral: Negative.      Objective:   Vitals:   10/05/19 1002  BP: (!) 124/64  Pulse: 58  Resp: 16  SpO2: 95%          Physical Exam Constitutional:      Appearance: She is not diaphoretic.  HENT:     Head: Normocephalic.     Right Ear: Tympanic membrane, ear canal and external ear normal.     Left Ear: Tympanic membrane, ear canal and external ear  normal.     Nose: Nose normal. No mucosal edema or rhinorrhea.     Mouth/Throat:     Pharynx: Uvula midline. No oropharyngeal exudate.  Eyes:     Conjunctiva/sclera: Conjunctivae normal.  Neck:     Thyroid: No thyromegaly.     Trachea: Trachea normal. No tracheal tenderness or tracheal deviation.  Cardiovascular:     Rate and Rhythm: Normal rate and regular rhythm.     Heart sounds: Normal heart sounds, S1 normal and S2 normal. No murmur heard.   Pulmonary:     Effort: No respiratory distress.     Breath sounds: Normal breath sounds. No stridor. No wheezing or rales.  Lymphadenopathy:     Head:     Right side of head: No tonsillar adenopathy.     Left side of head: No tonsillar adenopathy.      Cervical: No cervical adenopathy.  Skin:    Findings: No erythema or rash.     Nails: There is no clubbing.  Neurological:     Mental Status: She is alert.     Diagnostics: none  Assessment and Plan:   1. Perennial allergic rhinitis   2. Seasonal allergic rhinitis due to pollen   3. Seasonal allergic conjunctivitis   4. Pollen-food allergy, subsequent encounter   5. Gastroesophageal reflux disease, unspecified whether esophagitis present     1. Allergen avoidance measures - pollen, cat, dust mite. Sports glasses  2. Continue to Treat and prevent inflammation:   A. Montelukast 10 mg - 1 tablet 1 time per day  B. OTC Nasacort  / Rhinocort - 1 spray each nostril 3-7 times per week  3. Continue to Treat and prevent dry eye syndrome:   A. Avoid oral antihistamines  B. OTC Systane gel drops and regular drops  4. Continue to Treat and prevent reflux:   A. Consolidate caffeine and chocolate  B. Omeprazole 40 mg - 1 tablet 1 time per day  5. If needed:   A. Nasal saline  B. Patanase - 2 sprays each nostril 1-2 times per day  C. Desonide ointment to eyelids 1 time per day  D. Epi-Pen, benadryl, MD/ER evaluation for allergic reaction.  6. Return to clinic in 12 months or earlier if problem  7. Obtain fall flu vaccine  Maria Allison appears to be doing much better at this point in time regarding her atopic disease and reflux.  She will need to go through each season of the year to see if this plan is effective.  If not, she should consider a course of immunotherapy.  I will see her back in this clinic in 12 months or earlier if there is a problem.  Allena Katz, MD Allergy / Immunology [place]

## 2019-10-05 NOTE — Patient Instructions (Addendum)
  1. Allergen avoidance measures - pollen, cat, dust mite. Sports glasses  2. Continue to Treat and prevent inflammation:   A. Montelukast 10 mg - 1 tablet 1 time per day  B. OTC Nasacort  / Rhinocort - 1 spray each nostril 3-7 times per week  3. Continue to Treat and prevent dry eye syndrome:   A. Avoid oral antihistamines  B. OTC Systane gel drops and regular drops  4. Continue to Treat and prevent reflux:   A. Consolidate caffeine and chocolate  B. Omeprazole 40 mg - 1 tablet 1 time per day  5. If needed:   A. Nasal saline  B. Patanase - 2 sprays each nostril 1-2 times per day  C. Desonide ointment to eyelids 1 time per day  D. Epi-Pen, benadryl, MD/ER evaluation for allergic reaction.  6. Return to clinic in 12 months or earlier if problem  7. Obtain fall flu vaccine

## 2019-10-06 ENCOUNTER — Encounter: Payer: Self-pay | Admitting: Allergy and Immunology

## 2019-10-15 ENCOUNTER — Telehealth: Payer: PPO

## 2019-10-15 NOTE — Chronic Care Management (AMB) (Deleted)
Chronic Care Management Pharmacy  Name: Maria Allison  MRN: [id number] DOB: 04/17/1950  Chief Complaint/ HPI  Maria Allison,  69 y.o. , female presents for their Initial CCM visit with the clinical pharmacist {CHL HP Upstream Pharm visit GLOV:[id number]}.  PCP : Rochel Brome, MD  Their chronic conditions include: CAD, GERD, Osteoporosis, Depression, Hyperlipidemia  Office Visits:*** 08/19/2019 - ambulatory referral to allergy. Complete prednisone and continue allegra for periorbital swelling and redness.  Consult Visit:*** 10/05/2019 - [place] - continue to treat and prevent inflammation, allergan avoidance mesaures and continue to treat and prevent reflux.  09/07/2019 - [facility] - treat and prevent inflammation, avoid allergan sources, treat and prevent reflux.  Medications: 08/15/2019 - Urgent Care video visit - eyelids swollen due to likely contact dermatitis. Advised to begin oral prednisone, antibiotic eye drops and continue warm compresses.  06/09/2019 - Covid vaccine 05/14/2019 - Covid vaccine.   Outpatient Encounter Medications as of 10/15/2019  Medication Sig  . aspirin 81 MG tablet Take 81 mg by mouth daily.  . Calcium Carbonate-Vitamin D (CALCIUM-VITAMIN D3 PO) Take by mouth daily.  . Coenzyme Q10 (COQ-10 PO) Take 1 tablet by mouth daily.  Marland Kitchen desonide (DESOWEN) 0.05 % ointment Can apply to eyelids once daily if needed.  Marland Kitchen EPINEPHrine 0.3 mg/0.3 mL IJ SOAJ injection Use as directed for life-threatening allergic reaction.  . Fish Oil OIL Take 1 tablet by mouth daily.  . montelukast (SINGULAIR) 10 MG tablet Take 1 tablet (10 mg total) by mouth at bedtime.  . Multiple Vitamins-Minerals (EYE VITAMINS PO) Take by mouth.  . Olopatadine HCl 0.6 % SOLN Can use two sprays in each nostril one to two times daily if needed.  Marland Kitchen omeprazole (PRILOSEC) 40 MG capsule Take 1 capsule (40 mg total) by mouth daily.  . rosuvastatin (CRESTOR) 20 MG tablet TAKE 1 TABLET(20 MG) BY MOUTH  EVERY DAY  . sertraline (ZOLOFT) 50 MG tablet TAKE 1 TABLET BY MOUTH EVERY DAY   No facility-administered encounter medications on file as of 10/15/2019.   No Known Allergies  SDOH Screenings   Alcohol Screen:   . Last Alcohol Screening Score (AUDIT):   Depression (PHQ2-9):   . PHQ-2 Score:   Financial Resource Strain:   . Difficulty of Paying Living Expenses:   Food Insecurity:   . Worried About Charity fundraiser in the Last Year:   . [place] in the Last Year:   Housing:   . Last Housing Risk Score:   Physical Activity:   . Days of Exercise per Week:   . Minutes of Exercise per Session:   Social Connections:   . Frequency of Communication with Friends and Family:   . Frequency of Social Gatherings with Friends and Family:   . Attends Religious Services:   . Active Member of Clubs or Organizations:   . Attends Archivist Meetings:   Marland Kitchen Marital Status:   Stress:   . Feeling of Stress :   Tobacco Use: Low Risk   . Smoking Tobacco Use: Never Smoker  . Smokeless Tobacco Use: Never Used  Transportation Needs:   . Film/video editor (Medical):   Marland Kitchen Lack of Transportation (Non-Medical):      Current Diagnosis/Assessment:  Goals Addressed            This Visit's Progress   . Pharmacy Care Plan         {CHL [facility] Upstream Pharmacy Diagnosis/[phone]}  Hyperlipidemia/CAD   LDL goal < ***  Lipid Panel     Component Value Date/Time   CHOL 260 (H) 05/16/2016 1051   TRIG 76 05/16/2016 1051   HDL 102 05/16/2016 1051   LDLCALC 143 (H) 05/16/2016 1051    Hepatic Function Latest Ref Rng & Units 02/15/2016  Total Protein 6.1 - 8.1 g/dL 6.7  Albumin 3.6 - 5.1 g/dL 4.4  AST 10 - 35 U/L 19  ALT 6 - 29 U/L 14  Alk Phosphatase 33 - 130 U/L 61  Total Bilirubin 0.2 - 1.2 mg/dL 0.6  Bilirubin, Direct <=0.2 mg/dL 0.1     The ASCVD Risk score ([place] et al., 2013) failed to calculate for the following reasons:   Cannot find a  previous HDL lab   Cannot find a previous total cholesterol lab   Patient has failed these meds in past: *** Patient is currently {CHL Controlled/[phone]} on the following medications:  . Aspirin 81 mg daily . Rosuvastatin 20 mg daily . Fish oil daily *** . Coenzyme q10 daily  We discussed:  {CHL HP Upstream Pharmacy discussion:[id number]}  Plan  Continue {CHL HP Upstream Pharmacy Plans:[phone]}  ***GERD   Patient has failed these meds in past: *** Patient is currently {CHL Controlled/[phone]} on the following medications:  . Omeprazole 40 mg daily   We discussed:  ***  Plan  Continue {CHL HP Upstream Pharmacy XBJYN:[id number]}  ***Depression   Patient has failed these meds in past: *** Patient is currently {CHL Controlled/[phone]} on the following medications:  . ***sertraline 50 mg daily  We discussed:  ***  Plan  Continue {CHL HP Upstream Pharmacy Plans:[phone]}   ***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 Mc   Patient has failed these meds in past: ***cetirizine Patient is currently {CHL Controlled/[phone]} on the following medications:  . ***desonide ointment apply to eyelids if needed daily . epipen 0.3/0.3 ml use as directed for life-threatening allergic reaction . Montelukast 10 mg daily at bedtime . olapatadine 0.5% 2 sprays in each nostril 1-2 times daily if needed  We discussed:  ***  Plan  Continue {CHL HP Upstream Pharmacy Plans:[phone]}    Osteopenia / Osteoporosis   Last DEXA Scan: 04/2019  T-Score femoral neck: -2.3  T-Score lumbar spine: -1.6  10-year probability of major osteoporotic fracture: 13%  10-year probability of hip fracture: 2.9%  No results found for: VD25OH   Patient is not a candidate for pharmacologic treatment  Patient has failed these meds in past: vitamin D 2000 units daily Patient is currently controlled on the following medications:   . Calcium carbonate with Vitamin D daily   We discussed:  Recommend [phone] units of vitamin D daily. Recommend 1200 mg of calcium daily from dietary and supplemental sources. Recommend weight-bearing and muscle strengthening exercises for building and maintaining bone density.  Plan  Continue {CHL HP Upstream Pharmacy Plans:[id number]}   Health Maintenance   Patient is currently {CHL Controlled/[phone]} on the following medications:  . ***  We discussed:  ***  Plan  Continue {CHL [facility] Upstream Pharmacy MVHQI:[id number]}  Vaccines   Reviewed and discussed patient's vaccination history.    Immunization History  Administered Date(s) Administered  . PFIZER SARS-COV-2 Vaccination 05/14/2019, 06/09/2019    Plan  Recommended patient receive *** vaccine in *** office.   Medication Management   Pt uses [facility] in [place] for all medications Uses pill box? {Yes or If no, why not?:20788} Pt endorses ***% compliance  We discussed: ***  Plan  {US Pharmacy LKGM:[id number]}    Follow up: ***  month phone visit  ***

## 2019-10-19 DIAGNOSIS — H43813 Vitreous degeneration, bilateral: Secondary | ICD-10-CM | POA: Diagnosis not present

## 2019-10-19 NOTE — Chronic Care Management (AMB) (Deleted)
Chronic Care Management Pharmacy  Name: Maria Allison  MRN: [id number] DOB: 09/20/1950  Chief Complaint/ HPI  Maria Allison,  69 y.o. , female presents for their Initial CCM visit with the clinical pharmacist {CHL [facility] Pharm visit QIWL:[id number]}.  PCP : Maria Brome, MD  Their chronic conditions include: CAD, GERD, Osteoporosis, Depression, Hyperlipidemia  Office Visits:*** 08/19/2019 - ambulatory referral to allergy. Complete prednisone and continue allegra for periorbital swelling and redness.  Consult Visit:*** 10/05/2019 - Maria Allison - continue to treat and prevent inflammation, allergan avoidance mesaures and continue to treat and prevent reflux.  09/07/2019 - Maria Allison - treat and prevent inflammation, avoid allergan sources, treat and prevent reflux.  Medications: 08/15/2019 - Urgent Care video visit - eyelids swollen due to likely contact dermatitis. Advised to begin oral prednisone, antibiotic eye drops and continue warm compresses.  06/09/2019 - Covid vaccine 05/14/2019 - Covid vaccine.   Outpatient Encounter Medications as of 10/20/2019  Medication Sig   aspirin 81 MG tablet Take 81 mg by mouth daily.   Calcium Carbonate-Vitamin D (CALCIUM-VITAMIN D3 PO) Take by mouth daily.   Coenzyme Q10 (COQ-10 PO) Take 1 tablet by mouth daily.   desonide (DESOWEN) 0.05 % ointment Can apply to eyelids once daily if needed.   EPINEPHrine 0.3 mg/0.3 mL IJ SOAJ injection Use as directed for life-threatening allergic reaction.   Fish Oil OIL Take 1 tablet by mouth daily.   montelukast (SINGULAIR) 10 MG tablet Take 1 tablet (10 mg total) by mouth at bedtime.   Multiple Vitamins-Minerals (EYE VITAMINS PO) Take by mouth.   Olopatadine HCl 0.6 % SOLN Can use two sprays in each nostril one to two times daily if needed.   omeprazole (PRILOSEC) 40 MG capsule Take 1 capsule (40 mg total) by mouth daily.   rosuvastatin (CRESTOR) 20 MG tablet TAKE 1 TABLET(20 MG) BY MOUTH  EVERY DAY   sertraline (ZOLOFT) 50 MG tablet TAKE 1 TABLET BY MOUTH EVERY DAY   No facility-administered encounter medications on file as of 10/20/2019.   No Known Allergies  SDOH Screenings   Alcohol Screen:    Last Alcohol Screening Score (AUDIT):   Depression (PHQ2-9):    PHQ-2 Score:   Financial Resource Strain:    Difficulty of Paying Living Expenses:   Food Insecurity:    Worried About Charity fundraiser in the Last Year:    Arboriculturist in the Last Year:   Housing:    Last Housing Risk Score:   Physical Activity:    Days of Exercise per Week:    Minutes of Exercise per Session:   Social Connections:    Frequency of Communication with Friends and Family:    Frequency of Social Gatherings with Friends and Family:    Attends Religious Services:    Active Member of Clubs or Organizations:    Attends Music therapist:    Marital Status:   Stress:    Feeling of Stress :   Tobacco Use: Low Risk    Smoking Tobacco Use: Never Smoker   Smokeless Tobacco Use: Never Used  Transportation Needs:    Film/video editor (Medical):    Lack of Transportation (Non-Medical):      Current Diagnosis/Assessment:  Goals Addressed   None     {[facility] Diagnosis/Assessment:[phone]}  Hyperlipidemia/CAD   LDL goal < ***  Lipid Panel     Component Value Date/Time   CHOL 260 (H) 05/16/2016 1051  TRIG 76 05/16/2016 1051   HDL 102 05/16/2016 1051   LDLCALC 143 (H) 05/16/2016 1051    Hepatic Function Latest Ref Rng & Units 02/15/2016  Total Protein 6.1 - 8.1 g/dL 6.7  Albumin 3.6 - 5.1 g/dL 4.4  AST 10 - 35 U/L 19  ALT 6 - 29 U/L 14  Alk Phosphatase 33 - 130 U/L 61  Total Bilirubin 0.2 - 1.2 mg/dL 0.6  Bilirubin, Direct <=0.2 mg/dL 0.1     The ASCVD Risk score (Maria Allison., et al., 2013) failed to calculate for the following reasons:   Cannot find a previous HDL lab   Cannot find a previous total cholesterol lab    Patient has failed these meds in past: *** Patient is currently {CHL Controlled/Uncontrolled:[phone]} on the following medications:   Aspirin 81 mg daily  Rosuvastatin 20 mg daily  Fish oil daily ***  Coenzyme q10 daily  We discussed:  {[facility] Pharm[facility] discussion:[phone]}  Plan  Continue {[facility]L [facility] Pharmacy Plans:[id number]}  ***GERD   Patient has failed these meds in past: *** Patient is currently {CHL Controlled/Uncontrolled:[phone]} on the following medications:   Omeprazole 40 mg daily   We discussed:  ***  Plan  Continue {[facility] Pharmacy WGNFA:[id number]}  ***Depression   Patient has failed these meds in past: *** Patient is currently {CHL Controlled/Uncontrolled:[phone]} on the following medications:   ***sertraline 50 mg daily  We discussed:  ***  Plan  Continue {CHL [facility] Pharmacy Plans:9516186595}   ***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 Allison   Patient has failed these meds in past: ***cetirizine Patient is currently {CHL Controlled/Uncontrolled:[phone]} on the following medications:   ***desonide ointment apply to eyelids if needed daily  epipen 0.3/0.3 ml use as directed for life-threatening allergic reaction  Montelukast 10 mg daily at bedtime  olapatadine 0.5% 2 sprays in each nostril 1-2 times daily if needed  We discussed:  ***  Plan  Continue {CHL [facility] Up[facility] Pharmacy Plans:[id number]}    Osteopenia / Osteoporosis   Last DEXA Scan: 04/2019  T-Score femoral neck: -2.3  T-Score lumbar spine: -1.6  10-year probability of major osteoporotic fracture: 13%  10-year probability of hip fracture: 2.9%  No results found for: VD25OH   Patient is not a candidate for pharmacologic treatment  Patient has failed these meds in past: vitamin D 2000 units daily Patient is currently controlled on the following medications:   Calcium carbonate with Vitamin D daily   We discussed:   Recommend [phone] units of vitamin D daily. Recommend 1200 mg of calcium daily from dietary and supplemental sources. Recommend weight-bearing and muscle strengthening exercises for building and maintaining bone density.  Plan  Continue {[facility]L [facility] Pharmacy Plans:[id number]}   Health Maintenance   Patient is currently {CHL Controlled/Uncontrolled:[phone]} on the following medications:   ***  We discussed:  ***  Plan  Continue {[facility] Pharm[facility] ONGEX:[id number]}  Vaccines   Reviewed and discussed patient's vaccination history.    Immunization History  Administered Date(s) Administered   PFIZER SARS-COV-2 Vaccination 05/14/2019, 06/09/2019    Plan  Recommended patient receive *** vaccine in *** office.   Medication Management   Pt uses [facility] in [place] for all medications Uses pill box? {Yes or If no, why not?:20788} Pt endorses ***% compliance  We discussed: ***  Plan  {US Pharmacy NUUV:25366}    Follow up: *** month phone visit  ***

## 2019-10-20 ENCOUNTER — Telehealth: Payer: PPO

## 2019-10-28 ENCOUNTER — Ambulatory Visit: Payer: PPO

## 2019-10-28 NOTE — Chronic Care Management (AMB) (Deleted)
Chronic Care Management Pharmacy  Name: Maria Allison  MRN: [id number] DOB: [birth year]  Chief Complaint/ HPI  Maria Allison,  69 y.o. , female presents for their Initial CCM visit with the clinical pharmacist {[facility]L [facility] Pharm visit IZXY:[id number]}.  PCP : Rochel Brome, MD  Their chronic conditions include: CAD, GERD, Osteoporosis, Depression, Hyperlipidemia  Office Visits:*** 08/19/2019 - ambulatory referral to allergy. Complete prednisone and continue allegra for periorbital swelling and redness.  Consult Visit:*** 10/05/2019 - Kozlow - continue to treat and prevent inflammation, allergan avoidance mesaures and continue to treat and prevent reflux.  09/07/2019 - Kozlow - treat and prevent inflammation, avoid allergan sources, treat and prevent reflux.  Medications: 08/15/2019 - Urgent Care video visit - eyelids swollen due to likely contact dermatitis. Advised to begin oral prednisone, antibiotic eye drops and continue warm compresses.  06/09/2019 - Covid vaccine 05/14/2019 - Covid vaccine.   Outpatient Encounter Medications as of 10/28/2019  Medication Sig  . aspirin 81 MG tablet Take 81 mg by mouth daily.  . Calcium Carbonate-Vitamin D (CALCIUM-VITAMIN D3 PO) Take by mouth daily.  . Coenzyme Q10 (COQ-10 PO) Take 1 tablet by mouth daily.  Marland Kitchen desonide (DESOWEN) 0.05 % ointment Can apply to eyelids once daily if needed.  Marland Kitchen EPINEPHrine 0.3 mg/0.3 mL IJ SOAJ injection Use as directed for life-threatening allergic reaction.  . Fish Oil OIL Take 1 tablet by mouth daily.  . montelukast (SINGULAIR) 10 MG tablet Take 1 tablet (10 mg total) by mouth at bedtime.  . Multiple Vitamins-Minerals (EYE VITAMINS PO) Take by mouth.  . Olopatadine HCl 0.6 % SOLN Can use two sprays in each nostril one to two times daily if needed.  Marland Kitchen omeprazole (PRILOSEC) 40 MG capsule Take 1 capsule (40 mg total) by mouth daily.  . rosuvastatin (CRESTOR) 20 MG tablet TAKE 1 TABLET(20 MG) BY MOUTH  EVERY DAY  . sertraline (ZOLOFT) 50 MG tablet TAKE 1 TABLET BY MOUTH EVERY DAY   No facility-administered encounter medications on file as of 10/28/2019.   No Known Allergies  SDOH Screenings   Alcohol Screen:   . Last Alcohol Screening Score (AUDIT):   Depression (PHQ2-9):   . PHQ-2 Score:   Financial Resource Strain:   . Difficulty of Paying Living Expenses:   Food Insecurity:   . Worried About Charity fundraiser in the Last Year:   . [place] in the Last Year:   Housing:   . Last Housing Risk Score:   Physical Activity:   . Days of Exercise per Week:   . Minutes of Exercise per Session:   Social Connections:   . Frequency of Communication with Friends and Family:   . Frequency of Social Gatherings with Friends and Family:   . Attends Religious Services:   . Active Member of Clubs or Organizations:   . Attends Archivist Meetings:   Marland Kitchen Marital Status:   Stress:   . Feeling of Stress :   Tobacco Use: Low Risk   . Smoking Tobacco Use: Never Smoker  . Smokeless Tobacco Use: Never Used  Transportation Needs:   . Film/video editor (Medical):   Marland Kitchen Lack of Transportation (Non-Medical):      Current Diagnosis/Assessment:  Goals Addressed   None     {[facility] Diagnosis/Assessment:[phone]}  Hyperlipidemia/CAD   LDL goal < ***  Lipid Panel     Component Value Date/Time   CHOL 260 (H) 05/16/2016 1051  TRIG 76 05/16/2016 1051   HDL 102 05/16/2016 1051   LDLCALC 143 (H) 05/16/2016 1051    Hepatic Function Latest Ref Rng & Units 02/15/2016  Total Protein 6.1 - 8.1 g/dL 6.7  Albumin 3.6 - 5.1 g/dL 4.4  AST 10 - 35 U/L 19  ALT 6 - 29 U/L 14  Alk Phosphatase 33 - 130 U/L 61  Total Bilirubin 0.2 - 1.2 mg/dL 0.6  Bilirubin, Direct <=0.2 mg/dL 0.1     The ASCVD Risk score (Thayer., et al., 2013) failed to calculate for the following reasons:   Cannot find a previous HDL lab   Cannot find a previous total cholesterol lab    Patient has failed these meds in past: *** Patient is currently {CHL Controlled/Uncontrolled:[phone]} on the following medications:  . Aspirin 81 mg daily . Rosuvastatin 20 mg daily . Fish oil daily *** . Coenzyme q10 daily  We discussed:  {[facility]L [facility] Pharmacy discussion:[phone]}  Plan  Continue {CHL [facility] Pharmacy [phone]}  ***GERD   Patient has failed these meds in past: *** Patient is currently {CHL Controlled/Uncontrolled:[phone]} on the following medications:  . Omeprazole 40 mg daily   We discussed:  ***  Plan  Continue {[facility]L [facility] Pharmacy DHDIX:[id number]}  ***Depression   Patient has failed these meds in past: *** Patient is currently {CHL Controlled/Uncontrolled:[phone]} on the following medications:  . ***sertraline 50 mg daily  We discussed:  ***  Plan  Continue {CHL HP Upstream Pharmacy [phone]}   ***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 Mc   Patient has failed these meds in past: ***cetirizine Patient is currently {CHL Controlled/Uncontrolled:[phone]} on the following medications:  . ***desonide ointment apply to eyelids if needed daily . epipen 0.3/0.3 ml use as directed for life-threatening allergic reaction . Montelukast 10 mg daily at bedtime . olapatadine 0.5% 2 sprays in each nostril 1-2 times daily if needed  We discussed:  ***  Plan  Continue {CHL HP Upstream Pharmacy [phone]}    Osteopenia / Osteoporosis   Last DEXA Scan: 04/2019  T-Score femoral neck: -2.3  T-Score lumbar spine: -1.6  10-year probability of major osteoporotic fracture: 13%  10-year probability of hip fracture: 2.9%  No results found for: VD25OH   Patient is not a candidate for pharmacologic treatment  Patient has failed these meds in past: vitamin D 2000 units daily Patient is currently controlled on the following medications:  . Calcium carbonate with Vitamin D daily   We discussed:   Recommend [phone] units of vitamin D daily. Recommend 1200 mg of calcium daily from dietary and supplemental sources. Recommend weight-bearing and muscle strengthening exercises for building and maintaining bone density.  Plan  Continue {CHL [facility] Upstream Pharmacy [phone]}   Health Maintenance   Patient is currently {CHL Controlled/Uncontrolled:[phone]} on the following medications:  . ***  We discussed:  ***  Plan  Continue {CHL [facility] Pharmacy KSHNG:[id number]}  Vaccines   Reviewed and discussed patient's vaccination history.    Immunization History  Administered Date(s) Administered  . PFIZER SARS-COV-2 Vaccination 05/14/2019, 06/09/2019    Plan  Recommended patient receive *** vaccine in *** office.   Medication Management   Pt uses [facility] in [place] for all medications Uses pill box? {Yes or If no, why not?:20788} Pt endorses ***% compliance  We discussed: ***  Plan  {US Pharmacy EZBM:15868}    Follow up: *** month phone visit  ***

## 2019-11-05 ENCOUNTER — Other Ambulatory Visit: Payer: Self-pay

## 2019-11-05 ENCOUNTER — Ambulatory Visit (INDEPENDENT_AMBULATORY_CARE_PROVIDER_SITE_OTHER): Payer: PPO | Admitting: Family Medicine

## 2019-11-05 ENCOUNTER — Encounter: Payer: Self-pay | Admitting: Family Medicine

## 2019-11-05 VITALS — BP 102/76 | HR 68 | Temp 97.7°F | Resp 17 | Ht 61.0 in | Wt 122.6 lb

## 2019-11-05 DIAGNOSIS — G8929 Other chronic pain: Secondary | ICD-10-CM | POA: Insufficient documentation

## 2019-11-05 DIAGNOSIS — M25531 Pain in right wrist: Secondary | ICD-10-CM

## 2019-11-05 DIAGNOSIS — M19031 Primary osteoarthritis, right wrist: Secondary | ICD-10-CM | POA: Diagnosis not present

## 2019-11-05 MED ORDER — MELOXICAM 7.5 MG PO TABS: 7.5000 mg | ORAL_TABLET | Freq: Every day | ORAL | 0 refills | Status: DC

## 2019-11-05 NOTE — Progress Notes (Signed)
Acute Office Visit  Subjective:    Patient ID: Maria Allison, female    DOB: [birth year], 69 y.o.   MRN: [id number]  Chief Complaint  Patient presents with  . Joint Swelling    Sharp pain on right wrist since 2 months ago in few motion    HPI Patient is in today for right wrist pain -fell last year-slipped on wet floor at home landing on out-stretched right wrist.Steps are wooden. Pt states she went to radiology for xray(none available for wrist in system) pt states gradually her wrist pain improved last year. She has noted several weeks  of worsening pain in the right wrist-pt feels likely related to lifting 59 yo grandson.Pt states onset pain worsened 2 months ago-Pain Scale 0/10 at rest, Driving /lifting 3/41-DQQIW pain. No xray of wrist on the right -right elbow negative Past Medical History:  Diagnosis Date  . Chest pain   . Depression   . GERD (gastroesophageal reflux disease)   . Hyperlipemia   . Osteoporosis     Past Surgical History:  Procedure Laterality Date  . ABDOMINAL EXPLORATION SURGERY    . BREAST BIOPSY  1997  . CARDIAC CATHETERIZATION    . LUMBAR DISC SURGERY    . TONSILLECTOMY      Family History  Problem Relation Age of Onset  . Thrombosis Other        There is a family history of portal vein thrombosis for which family screening has been recommended at [facility]  . Leukemia Mother   . Heart disease Father   . Kidney cancer Father   . Heart disease Maternal Grandmother   . Ulcerative colitis Maternal Grandfather   . Lung cancer Paternal Grandmother   . Liver cancer Paternal Grandmother   . Heart disease Brother   . Heart attack Brother     Social History   Socioeconomic History  . Marital status: Married    Spouse name: Not on file  . Number of children: 1  . Years of education: Not on file  . Highest education level: Some college, no degree  Occupational History  . Occupation: Retired  Tobacco Use  . Smoking status: Never Smoker  .  Smokeless tobacco: Never Used  Substance and Sexual Activity  . Alcohol use: Yes    Alcohol/week: 1.0 standard drink    Types: 1 Glasses of wine per week    Comment: four times per week  . Drug use: No  . Sexual activity: Yes    Partners: Male  Other Topics Concern  . Not on file  Social History Narrative   The patient lives in [place] with her husband.    She has one daughter who is in college at [employer].    She admits to just occasional alcohol use.  She denies any tobacco    abuse.         Social Determinants of Health   Financial Resource Strain:   . Difficulty of Paying Living Expenses: Not on file  Food Insecurity:   . Worried About Charity fundraiser in the Last Year: Not on file  . Ran Out of Food in the Last Year: Not on file  Transportation Needs:   . Lack of Transportation (Medical): Not on file  . Lack of Transportation (Non-Medical): Not on file  Physical Activity:   . Days of Exercise per Week: Not on file  . Minutes of Exercise per Session: Not on file  Stress:   .  Feeling of Stress : Not on file  Social Connections:   . Frequency of Communication with Friends and Family: Not on file  . Frequency of Social Gatherings with Friends and Family: Not on file  . Attends Religious Services: Not on file  . Active Member of Clubs or Organizations: Not on file  . Attends Archivist Meetings: Not on file  . Marital Status: Not on file  Intimate Partner Violence:   . Fear of Current or Ex-Partner: Not on file  . Emotionally Abused: Not on file  . Physically Abused: Not on file  . Sexually Abused: Not on file    Outpatient Medications Prior to Visit  Medication Sig Dispense Refill  . aspirin 81 MG tablet Take 81 mg by mouth daily.    . Calcium Carbonate-Vitamin D (CALCIUM-VITAMIN D3 PO) Take by mouth daily.    . Coenzyme Q10 (COQ-10 PO) Take 1 tablet by mouth daily.    Marland Kitchen desonide (DESOWEN) 0.05 % ointment Can apply to eyelids  once daily if needed. 15 g 5  . EPINEPHrine 0.3 mg/0.3 mL IJ SOAJ injection Use as directed for life-threatening allergic reaction. 1 each 3  . Fish Oil OIL Take 1 tablet by mouth daily.    . montelukast (SINGULAIR) 10 MG tablet Take 1 tablet (10 mg total) by mouth at bedtime. 30 tablet 5  . omeprazole (PRILOSEC) 40 MG capsule Take 1 capsule (40 mg total) by mouth daily. 90 capsule 1  . Polyethyl Glycol-Propyl Glycol (SYSTANE) 0.4-0.3 % SOLN Apply to eye.    . rosuvastatin (CRESTOR) 20 MG tablet TAKE 1 TABLET(20 MG) BY MOUTH EVERY DAY 30 tablet 2  . sertraline (ZOLOFT) 50 MG tablet TAKE 1 TABLET BY MOUTH EVERY DAY 90 tablet 0  . Multiple Vitamins-Minerals (EYE VITAMINS PO) Take by mouth.    . Olopatadine HCl 0.6 % SOLN Can use two sprays in each nostril one to two times daily if needed. 30.5 g 5   No facility-administered medications prior to visit.    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  Musculoskeletal:        General: Tenderness present.     Comments: Right wrist-FROM, tenderness to palpation, no skin change, carpal tunnel scar noted  Neurological:     Mental Status: She is alert.     BP 102/76 (BP Location: Right Arm, Patient Position: Sitting)   Pulse 68   Temp 97.7 F (36.5 C) (Temporal)   Resp 17   Ht 5\' 1"  (1.549 m)   Wt 122 lb 9.6 oz (55.6 kg)   SpO2 98%   BMI 23.17 kg/m  Wt Readings from Last 3 Encounters:  11/05/19 122 lb 9.6 oz (55.6 kg)  09/07/19 122 lb 9.6 oz (55.6 kg)  08/19/19 122 lb 6.4 oz (55.5 kg)    Health Maintenance Due  Topic Date Due  . Hepatitis C Screening  Never done  . TETANUS/TDAP  Never done  . COLONOSCOPY  Never done  . MAMMOGRAM  09/05/2014  . PNA vac Low Risk Adult (1 of 2 - PCV13) Never done  . INFLUENZA VACCINE  10/11/2019    Lab Results  Component Value Date    WBC 5.3 02/28/2009   HGB 13.1 02/28/2009   HCT 37.7 02/28/2009   MCV 91.4  02/28/2009   PLT 220 02/28/2009   Lab Results  Component Value Date   NA 139 06/27/2008   K 3.7 06/27/2008   CO2 25 06/27/2008   GLUCOSE 148 (H) 06/27/2008   BUN 11 06/27/2008   CREATININE 0.75 06/27/2008   BILITOT 0.6 02/15/2016   ALKPHOS 61 02/15/2016   AST 19 02/15/2016   ALT 14 02/15/2016   PROT 6.7 02/15/2016   ALBUMIN 4.4 02/15/2016   CALCIUM 8.9 06/27/2008   Lab Results  Component Value Date   CHOL 260 (H) 05/16/2016   Lab Results  Component Value Date   HDL 102 05/16/2016   Lab Results  Component Value Date   LDLCALC 143 (H) 05/16/2016   Lab Results  Component Value Date   TRIG 76 05/16/2016   Lab Results  Component Value Date   CHOLHDL 2.5 05/16/2016      Assessment & Plan:  1. Chronic pain of right wrist Xray-none last year with acute event-pt request PT referral to hand specialist-suggested mobic with food for inflammation - DG Wrist Complete Right; Future  Neera Teng Hannah Beat, MD

## 2019-11-05 NOTE — Patient Instructions (Signed)
Xray right wrist mobic -pick up at pharmacy Therapy referral

## 2019-11-09 ENCOUNTER — Ambulatory Visit: Payer: PPO | Admitting: Family Medicine

## 2019-11-12 ENCOUNTER — Encounter: Payer: Self-pay | Admitting: Family Medicine

## 2019-11-18 DIAGNOSIS — M25531 Pain in right wrist: Secondary | ICD-10-CM | POA: Diagnosis not present

## 2019-11-18 DIAGNOSIS — M654 Radial styloid tenosynovitis [de Quervain]: Secondary | ICD-10-CM | POA: Diagnosis not present

## 2019-11-18 DIAGNOSIS — M6281 Muscle weakness (generalized): Secondary | ICD-10-CM | POA: Diagnosis not present

## 2019-11-18 DIAGNOSIS — M25431 Effusion, right wrist: Secondary | ICD-10-CM | POA: Diagnosis not present

## 2019-11-20 DIAGNOSIS — M25531 Pain in right wrist: Secondary | ICD-10-CM | POA: Diagnosis not present

## 2019-11-20 DIAGNOSIS — M6281 Muscle weakness (generalized): Secondary | ICD-10-CM | POA: Diagnosis not present

## 2019-11-20 DIAGNOSIS — M25431 Effusion, right wrist: Secondary | ICD-10-CM | POA: Diagnosis not present

## 2019-11-20 DIAGNOSIS — M654 Radial styloid tenosynovitis [de Quervain]: Secondary | ICD-10-CM | POA: Diagnosis not present

## 2019-11-23 DIAGNOSIS — M25431 Effusion, right wrist: Secondary | ICD-10-CM | POA: Diagnosis not present

## 2019-11-23 DIAGNOSIS — M25531 Pain in right wrist: Secondary | ICD-10-CM | POA: Diagnosis not present

## 2019-11-23 DIAGNOSIS — M654 Radial styloid tenosynovitis [de Quervain]: Secondary | ICD-10-CM | POA: Diagnosis not present

## 2019-11-23 DIAGNOSIS — M6281 Muscle weakness (generalized): Secondary | ICD-10-CM | POA: Diagnosis not present

## 2019-11-24 DIAGNOSIS — M25431 Effusion, right wrist: Secondary | ICD-10-CM | POA: Diagnosis not present

## 2019-11-24 DIAGNOSIS — M654 Radial styloid tenosynovitis [de Quervain]: Secondary | ICD-10-CM | POA: Diagnosis not present

## 2019-11-24 DIAGNOSIS — M6281 Muscle weakness (generalized): Secondary | ICD-10-CM | POA: Diagnosis not present

## 2019-11-24 DIAGNOSIS — M25531 Pain in right wrist: Secondary | ICD-10-CM | POA: Diagnosis not present

## 2019-12-03 DIAGNOSIS — M6281 Muscle weakness (generalized): Secondary | ICD-10-CM | POA: Diagnosis not present

## 2019-12-03 DIAGNOSIS — M25431 Effusion, right wrist: Secondary | ICD-10-CM | POA: Diagnosis not present

## 2019-12-03 DIAGNOSIS — M654 Radial styloid tenosynovitis [de Quervain]: Secondary | ICD-10-CM | POA: Diagnosis not present

## 2019-12-03 DIAGNOSIS — M25531 Pain in right wrist: Secondary | ICD-10-CM | POA: Diagnosis not present

## 2019-12-04 DIAGNOSIS — M654 Radial styloid tenosynovitis [de Quervain]: Secondary | ICD-10-CM | POA: Diagnosis not present

## 2019-12-04 DIAGNOSIS — M25531 Pain in right wrist: Secondary | ICD-10-CM | POA: Diagnosis not present

## 2019-12-04 DIAGNOSIS — M25431 Effusion, right wrist: Secondary | ICD-10-CM | POA: Diagnosis not present

## 2019-12-04 DIAGNOSIS — M6281 Muscle weakness (generalized): Secondary | ICD-10-CM | POA: Diagnosis not present

## 2019-12-11 ENCOUNTER — Other Ambulatory Visit: Payer: Self-pay | Admitting: Family Medicine

## 2019-12-11 ENCOUNTER — Other Ambulatory Visit: Payer: Self-pay

## 2019-12-11 MED ORDER — MELOXICAM 7.5 MG PO TABS
7.5000 mg | ORAL_TABLET | Freq: Every day | ORAL | 0 refills | Status: DC
Start: 2019-12-11 — End: 2020-03-08

## 2020-01-08 ENCOUNTER — Other Ambulatory Visit: Payer: Self-pay | Admitting: Physician Assistant

## 2020-01-21 ENCOUNTER — Telehealth: Payer: Self-pay

## 2020-01-21 NOTE — Progress Notes (Signed)
    Chronic Care Management Pharmacy Assistant   Name: Maria Allison  MRN: [id number] DOB: May 29, 1950  Reason for Encounter: Medication Review  Patient Questions:    Maria Allison,  69 y.o. , female .  PCP : Rochel Brome, MD  Allergies:  No Known Allergies  Medications: Outpatient Encounter Medications as of 01/21/2020  Medication Sig  . aspirin 81 MG tablet Take 81 mg by mouth daily.  . Calcium Carbonate-Vitamin D (CALCIUM-VITAMIN D3 PO) Take by mouth daily.  . Coenzyme Q10 (COQ-10 PO) Take 1 tablet by mouth daily.  Marland Kitchen desonide (DESOWEN) 0.05 % ointment Can apply to eyelids once daily if needed.  Marland Kitchen EPINEPHrine 0.3 mg/0.3 mL IJ SOAJ injection Use as directed for life-threatening allergic reaction.  . Fish Oil OIL Take 1 tablet by mouth daily.  . meloxicam (MOBIC) 7.5 MG tablet Take 1 tablet (7.5 mg total) by mouth daily.  . montelukast (SINGULAIR) 10 MG tablet Take 1 tablet (10 mg total) by mouth at bedtime.  Marland Kitchen omeprazole (PRILOSEC) 40 MG capsule Take 1 capsule (40 mg total) by mouth daily.  Vladimir Faster Glycol-Propyl Glycol (SYSTANE) 0.4-0.3 % SOLN Apply to eye.  . rosuvastatin (CRESTOR) 20 MG tablet TAKE 1 TABLET(20 MG) BY MOUTH EVERY DAY  . sertraline (ZOLOFT) 50 MG tablet TAKE 1 TABLET BY MOUTH EVERY DAY   No facility-administered encounter medications on file as of 01/21/2020.    Current Diagnosis: Patient Active Problem List   Diagnosis Date Noted  . Chronic pain of right wrist 11/05/2019  . Carotid arterial disease ([place]) 09/26/2011  . Osteoporosis   . Depression   . Hypercholesteremia   . Chest pain   . Hyperlipemia   . GERD (gastroesophageal reflux disease)     Goals Addressed   None     Follow-Up:  Coordination of Enhanced Pharmacy Services   Pt adherence report shows pt has had an issue with Rosuvastatin . [place] in Aug 80%, Sept 82% and Oct 84.4%.  Called pt to discuss Rosuvastatin barrier.   11/11 Left message 11/16 No answer 11/19 Left message

## 2020-02-10 ENCOUNTER — Other Ambulatory Visit: Payer: Self-pay | Admitting: Physician Assistant

## 2020-02-12 ENCOUNTER — Telehealth: Payer: Self-pay

## 2020-02-12 NOTE — Progress Notes (Signed)
Pt adherence report shows pt has had an issue with Rosuvastatin . [place] in Aug 80%, Sept 82% and Oct 84.4%.  Called pt to discuss Rosuvastatin barrier. Pt had medication filled in [facility] while on vacation due to forgetting medicine. Pt has an overabundance of medicine and is not on auto fill. PharmD notified  Doristine Counter, [facility]acy Assistant

## 2020-03-01 ENCOUNTER — Other Ambulatory Visit: Payer: PPO

## 2020-03-01 ENCOUNTER — Other Ambulatory Visit: Payer: Self-pay

## 2020-03-02 ENCOUNTER — Encounter: Payer: Self-pay | Admitting: Family Medicine

## 2020-03-02 ENCOUNTER — Ambulatory Visit (INDEPENDENT_AMBULATORY_CARE_PROVIDER_SITE_OTHER): Payer: PPO | Admitting: Family Medicine

## 2020-03-02 ENCOUNTER — Other Ambulatory Visit: Payer: Self-pay

## 2020-03-02 VITALS — BP 110/70 | HR 64 | Temp 97.9°F | Resp 16 | Ht 61.0 in | Wt 123.0 lb

## 2020-03-02 DIAGNOSIS — Z0000 Encounter for general adult medical examination without abnormal findings: Secondary | ICD-10-CM

## 2020-03-02 DIAGNOSIS — I6523 Occlusion and stenosis of bilateral carotid arteries: Secondary | ICD-10-CM

## 2020-03-02 DIAGNOSIS — E782 Mixed hyperlipidemia: Secondary | ICD-10-CM | POA: Diagnosis not present

## 2020-03-02 DIAGNOSIS — N952 Postmenopausal atrophic vaginitis: Secondary | ICD-10-CM | POA: Insufficient documentation

## 2020-03-02 NOTE — Patient Instructions (Addendum)
Ms. Levier , Thank you for taking time to come for your Medicare Wellness Visit. I appreciate your ongoing commitment to your health goals. Please review the following plan we discussed and let me know if I can assist you in the future.   These are the goals we discussed:  Calcium Citrate with Vitamin D 1200 mg daily.  Hepatitis B vaccination #2  Shingrix (series of 2 shots)  This is a list of the screening recommended for you and due dates:  Health Maintenance  Topic Date Due  .  Hepatitis C: One time screening is recommended by Center for Disease Control  (CDC) for  adults born from [birth year] through 1965.   Never done  . Mammogram  04/26/2019  . COVID-19 Vaccine (3 - Booster for Pfizer series) 12/10/2019  . DEXA scan (bone density measurement)  04/29/2021  . Colon Cancer Screening  08/24/2024  . Tetanus Vaccine  05/29/2027  . Flu Shot  Completed  . Pneumonia vaccines  Completed    Preventive Care 67 Years and Older, Female Preventive care refers to lifestyle choices and visits with your health care provider that can promote health and wellness. This includes:  A yearly physical exam. This is also called an annual well check.  Regular dental and eye exams.  Immunizations.  Screening for certain conditions.  Healthy lifestyle choices, such as diet and exercise. What can I expect for my preventive care visit? Physical exam Your health care provider will check:  Height and weight. These may be used to calculate body mass index (BMI), which is a measurement that tells if you are at a healthy weight.  Heart rate and blood pressure.  Your skin for abnormal spots. Counseling Your health care provider may ask you questions about:  Alcohol, tobacco, and drug use.  Emotional well-being.  Home and relationship well-being.  Sexual activity.  Eating habits.  History of falls.  Memory and ability to understand (cognition).  Work and work Statistician.  Pregnancy and  menstrual history. What immunizations do I need?  Influenza (flu) vaccine  This is recommended every year. Tetanus, diphtheria, and pertussis (Tdap) vaccine  You may need a Td booster every 10 years. Varicella (chickenpox) vaccine  You may need this vaccine if you have not already been vaccinated. Zoster (shingles) vaccine  You may need this after age 41. Pneumococcal conjugate (PCV13) vaccine  One dose is recommended after age 51. Pneumococcal polysaccharide (PPSV23) vaccine  One dose is recommended after age 26. Measles, mumps, and rubella (MMR) vaccine  You may need at least one dose of MMR if you were born in 1957 or later. You may also need a second dose. Meningococcal conjugate (MenACWY) vaccine  You may need this if you have certain conditions. Hepatitis A vaccine  You may need this if you have certain conditions or if you travel or work in places where you may be exposed to hepatitis A. Hepatitis B vaccine  You may need this if you have certain conditions or if you travel or work in places where you may be exposed to hepatitis B. Haemophilus influenzae type b (Hib) vaccine  You may need this if you have certain conditions. You may receive vaccines as individual doses or as more than one vaccine together in one shot (combination vaccines). Talk with your health care provider about the risks and benefits of combination vaccines. What tests do I need? Blood tests  Lipid and cholesterol levels. These may be checked every 5 years, or more  frequently depending on your overall health.  Hepatitis C test.  Hepatitis B test. Screening  Lung cancer screening. You may have this screening every year starting at age 61 if you have a 30-pack-year history of smoking and currently smoke or have quit within the past 15 years.  Colorectal cancer screening. All adults should have this screening starting at age 76 and continuing until age 18. Your health care provider may  recommend screening at age 56 if you are at increased risk. You will have tests every 1-10 years, depending on your results and the type of screening test.  Diabetes screening. This is done by checking your blood sugar (glucose) after you have not eaten for a while (fasting). You may have this done every 1-3 years.  Mammogram. This may be done every 1-2 years. Talk with your health care provider about how often you should have regular mammograms.  BRCA-related cancer screening. This may be done if you have a family history of breast, ovarian, tubal, or peritoneal cancers. Other tests  Sexually transmitted disease (STD) testing.  Bone density scan. This is done to screen for osteoporosis. You may have this done starting at age 26. Follow these instructions at home: Eating and drinking  Eat a diet that includes fresh fruits and vegetables, whole grains, lean protein, and low-fat dairy products. Limit your intake of foods with high amounts of sugar, saturated fats, and salt.  Take vitamin and mineral supplements as recommended by your health care provider.  Do not drink alcohol if your health care provider tells you not to drink.  If you drink alcohol: ? Limit how much you have to 0-1 drink a day. ? Be aware of how much alcohol is in your drink. In the [place] one drink equals one 12 oz bottle of beer (355 mL), one 5 oz glass of wine (148 mL), or one 1 oz glass of hard liquor (44 mL). Lifestyle  Take daily care of your teeth and gums.  Stay active. Exercise for at least 30 minutes on 5 or more days each week.  Do not use any products that contain nicotine or tobacco, such as cigarettes, e-cigarettes, and chewing tobacco. If you need help quitting, ask your health care provider.  If you are sexually active, practice safe sex. Use a condom or other form of protection in order to prevent STIs (sexually transmitted infections).  Talk with your health care provider about taking a low-dose  aspirin or statin. What's next?  Go to your health care provider once a year for a well check visit.  Ask your health care provider how often you should have your eyes and teeth checked.  Stay up to date on all vaccines. This information is not intended to replace advice given to you by your health care provider. Make sure you discuss any questions you have with your health care provider. Document Revised: 02/20/2018 Document Reviewed: 02/20/2018  Osteopenia  Osteopenia is a loss of thickness (density) inside of the bones. Another name for osteopenia is low bone mass. Mild osteopenia is a normal part of aging. It is not a disease, and it does not cause symptoms. However, if you have osteopenia and continue to lose bone mass, you could develop a condition that causes the bones to become thin and break more easily (osteoporosis). You may also lose some height, have back pain, and have a stooped posture. Although osteopenia is not a disease, making changes to your lifestyle and diet can help to prevent  osteopenia from developing into osteoporosis. What are the causes? Osteopenia is caused by loss of calcium in the bones.  Bones are constantly changing. Old bone cells are continually being replaced with new bone cells. This process builds new bone. The mineral calcium is needed to build new bone and maintain bone density. Bone density is usually highest around age 63. After that, most people's bodies cannot replace all the bone they have lost with new bone. What increases the risk? You are more likely to develop this condition if:  You are older than age 32.  You are a woman who went through menopause early.  You have a long illness that keeps you in bed.  You do not get enough exercise.  You lack certain nutrients (malnutrition).  You have an overactive thyroid gland (hyperthyroidism).  You smoke.  You drink a lot of alcohol.  You are taking medicines that weaken the bones, such as  steroids. What are the signs or symptoms? This condition does not cause any symptoms. You may have a slightly higher risk for bone breaks (fractures), so getting fractures more easily than normal may be an indication of osteopenia. How is this diagnosed? Your health care provider can diagnose this condition with a special type of X-ray exam that measures bone density (dual-energy X-ray absorptiometry, DEXA). This test can measure bone density in your hips, spine, and wrists. Osteopenia has no symptoms, so this condition is usually diagnosed after a routine bone density screening test is done for osteoporosis. This routine screening is usually done for:  Women who are age 30 or older.  Men who are age 39 or older. If you have risk factors for osteopenia, you may have the screening test at an earlier age. How is this treated? Making dietary and lifestyle changes can lower your risk for osteoporosis. If you have severe osteopenia that is close to becoming osteoporosis, your health care provider may prescribe medicines and dietary supplements such as calcium and vitamin D. These supplements help to rebuild bone density. Follow these instructions at home:   Take over-the-counter and prescription medicines only as told by your health care provider. These include vitamins and supplements.  Eat a diet that is high in calcium and vitamin D. ? Calcium is found in dairy products, beans, salmon, and leafy green vegetables like spinach and broccoli. ? Look for foods that have vitamin D and calcium added to them (fortified foods), such as orange juice, cereal, and bread.  Do 30 or more minutes of a weight-bearing exercise every day, such as walking, jogging, or playing a sport. These types of exercises strengthen the bones.  Take precautions at home to lower your risk of falling, such as: ? Keeping rooms well-lit and free of clutter, such as cords. ? Installing safety rails on stairs. ? Using rubber  mats in the bathroom or other areas that are often wet or slippery.  Do not use any products that contain nicotine or tobacco, such as cigarettes and e-cigarettes. If you need help quitting, ask your health care provider.  Avoid alcohol or limit alcohol intake to no more than 1 drink a day for nonpregnant women and 2 drinks a day for men. One drink equals 12 oz of beer, 5 oz of wine, or 1 oz of hard liquor.  Keep all follow-up visits as told by your health care provider. This is important. Contact a health care provider if:  You have not had a bone density screening for osteoporosis and you  are: ? A woman, age 42 or older. ? A man, age 29 or older.  You are a postmenopausal woman who has not had a bone density screening for osteoporosis.  You are older than age 15 and you want to know if you should have bone density screening for osteoporosis. Summary  Osteopenia is a loss of thickness (density) inside of the bones. Another name for osteopenia is low bone mass.  Osteopenia is not a disease, but it may increase your risk for a condition that causes the bones to become thin and break more easily (osteoporosis).  You may be at risk for osteopenia if you are older than age 53 or if you are a woman who went through early menopause.  Osteopenia does not cause any symptoms, but it can be diagnosed with a bone density screening test.  Dietary and lifestyle changes are the first treatment for osteopenia. These may lower your risk for osteoporosis. This information is not intended to replace advice given to you by your health care provider. Make sure you discuss any questions you have with your health care provider. Document Revised: 02/08/2017 Document Reviewed: 12/05/2016 Elsevier Patient Education  2020 [place] Patient Education  [employer].

## 2020-03-02 NOTE — Progress Notes (Signed)
Subjective:  Patient ID: Maria Allison, female    DOB: 1950-06-21  Age: 69 y.o. MRN: [id number]  Chief Complaint  Patient presents with  . Annual Exam    AWV     HPI Encounter for general adult medical examination without abnormal findings  Physical ("At Risk" items are starred): Patient's last physical exam was 1 year ago .  Smoking: Life-long non-smoker ;  Physical Activity: Exercises at least 3 times per week ;  Alcohol/Drug Use: red wine ; No illicit drug use ;  Patient is not afflicted from Stress Incontinence and Urge Incontinence  Safety: reviewed ; Patient wears a seat belt, has smoke detectors, has carbon monoxide detectors, practices appropriate gun safety, and wears sunscreen with extended sun exposure. Dental Care: biannual cleanings, brushes and flosses daily. Ophthalmology/Optometry: Annual visit.  Hearing loss: none Vision impairments: none  DEXA 04/2019 Mammogram 01/2020.  Fall Risk  03/09/2020 03/02/2020  Falls in the past year? - 0  Number falls in past yr: - 0  Injury with Fall? - 0  Risk for fall due to : No Fall Risks Other (Comment)  Follow up Falls evaluation completed;Falls prevention discussed -     [facility] 03/02/2020 03/02/2020  Decreased Interest 0 0  Down, Depressed, Hopeless 0 0  PHQ - 2 Score 0 0       Functional Status Survey: Is the patient deaf or have difficulty hearing?: No Does the patient have difficulty seeing, even when wearing glasses/contacts?: No Does the patient have difficulty concentrating, remembering, or making decisions?: No Does the patient have difficulty walking or climbing stairs?: No Does the patient have difficulty dressing or bathing?: No Does the patient have difficulty doing errands alone such as visiting a doctor's office or shopping?: No   Social Hx   Social History   Socioeconomic History  . Marital status: Married    Spouse name: Not on file  . Number of children: 1  . Years of  education: Not on file  . Highest education level: Some college, no degree  Occupational History  . Occupation: Retired  Tobacco Use  . Smoking status: Never Smoker  . Smokeless tobacco: Never Used  Vaping Use  . Vaping Use: Never used  Substance and Sexual Activity  . Alcohol use: Yes    Alcohol/week: 1.0 standard drink    Types: 1 Glasses of wine per week    Comment: five times per week  . Drug use: No  . Sexual activity: Yes    Partners: Male    Birth control/protection: None  Other Topics Concern  . Not on file  Social History Narrative   The [place] lives in [place] with her [place].    She has one daughter who is in college at [employer].    She admits to just occasional alcohol use.  She denies any tobacco    abuse.         Social Determinants of Health   Financial Resource Strain: Not on file  Food Insecurity: Not on file  Transportation Needs: No Transportation Needs  . Lack of Transportation (Medical): No  . Lack of Transportation (Non-Medical): No  Physical Activity: Sufficiently Active  . Days of Exercise per Week: 5 days  . Minutes of Exercise per Session: 150+ min  Stress: Not on file  Social Connections: Not on file   Past Medical History:  Diagnosis Date  . Chest pain   . Depression   . GERD (gastroesophageal  reflux disease)   . Hyperlipemia   . Osteoporosis    Family History  Problem Relation Age of Onset  . Thrombosis Other        There is a family history of portal vein thrombosis for which family screening has been recommended at [facility]  . Leukemia Mother   . Heart disease Father   . Kidney cancer Father   . Heart disease Maternal Grandmother   . Ulcerative colitis Maternal Grandfather   . Lung cancer Paternal Grandmother   . Liver cancer Paternal Grandmother   . Heart disease Brother   . Heart attack Brother     Review of Systems  Constitutional: Negative for chills, fatigue and fever.  HENT: Negative for  congestion, ear pain and sore throat.   Respiratory: Negative for cough and shortness of breath.   Cardiovascular: Negative for chest pain.  Gastrointestinal: Negative for abdominal pain, constipation, diarrhea, nausea and vomiting.  Genitourinary: Negative for dysuria and urgency.  Musculoskeletal: Negative for arthralgias and myalgias.  Skin: Negative for rash.  Neurological: Negative for dizziness and headaches.  Psychiatric/Behavioral: Negative for dysphoric mood. The patient is not nervous/anxious.      Objective:  BP 110/70   Pulse 64   Temp 97.9 F (36.6 C)   Resp 16   Ht 5\' 1"  (1.549 m)   Wt 123 lb (55.8 kg)   SpO2 95%   BMI 23.24 kg/m   BP/Weight 03/08/2020 03/02/2020 [id number]  Systolic BP [id number]  Diastolic BP 76 70 76  Wt. (Lbs) 124 123 122.6  BMI 23.43 23.24 23.17    Physical Exam Vitals reviewed.  Constitutional:      Appearance: Normal appearance.  Cardiovascular:     Rate and Rhythm: Normal rate and regular rhythm.     Heart sounds: Normal heart sounds.  Neurological:     Mental Status: She is alert and oriented to person, place, and time.  Psychiatric:        Mood and Affect: Mood normal.        Behavior: Behavior normal.     Lab Results  Component Value Date   WBC 4.7 03/02/2020   HGB 13.2 03/02/2020   HCT 40.0 03/02/2020   PLT 253 03/02/2020   GLUCOSE 93 03/02/2020   CHOL 205 (H) 03/02/2020   TRIG 73 03/02/2020   HDL 86 03/02/2020   LDLCALC 106 (H) 03/02/2020   ALT 17 03/02/2020   AST 20 03/02/2020   NA 138 03/02/2020   K 4.6 03/02/2020   CL 102 03/02/2020   CREATININE 0.70 03/02/2020   BUN 16 03/02/2020   CO2 24 03/02/2020   TSH 2.580 03/02/2020      Assessment & Plan:  1. Well adult exam These are the goals we discussed: Calcium Citrate with Vitamin D 1200 mg daily.  Hepatitis B vaccination #2  Shingrix (series of 2 shots)  2. Mixed hyperlipidemia - CBC with Differential/Platelet - Comprehensive metabolic panel -  Lipid panel - TSH  3. Atherosclerosis of both carotid arteries  - lipid panel.  -non critical stenosis.   This is a list of the screening recommended for you and due dates:  Health Maintenance  Topic Date Due  .  Hepatitis C: One time screening is recommended by Center for Disease Control  (CDC) for  adults born from [birth year] through 1965.   Never done  . Mammogram  04/26/2019  . COVID-19 Vaccine (3 - Booster for Pfizer series) 12/10/2019  . DEXA scan (bone  density measurement)  04/29/2021  . Colon Cancer Screening  08/24/2024  . Tetanus Vaccine  05/29/2027  . Flu Shot  Completed  . Pneumonia vaccines  Completed     AN INDIVIDUALIZED CARE PLAN: was established or reinforced today.   SELF MANAGEMENT: The patient and I together assessed ways to personally work towards obtaining the recommended goals  Support needs The patient and/or family needs were assessed and services were offered and not necessary at this time.    Follow-up: Return in about 1 year (around 03/02/2021) for fasting.  Rochel Brome, MD [facility] [phone]

## 2020-03-03 LAB — CBC WITH DIFFERENTIAL/PLATELET
Basophils Absolute: 0 10*3/uL (ref 0.0–0.2)
Basos: 1 %
EOS (ABSOLUTE): 0.1 10*3/uL (ref 0.0–0.4)
Eos: 2 %
Hematocrit: 40 % (ref 34.0–46.6)
Hemoglobin: 13.2 g/dL (ref 11.1–15.9)
Immature Grans (Abs): 0 10*3/uL (ref 0.0–0.1)
Immature Granulocytes: 0 %
Lymphocytes Absolute: 1.4 10*3/uL (ref 0.7–3.1)
Lymphs: 30 %
MCH: 30.3 pg (ref 26.6–33.0)
MCHC: 33 g/dL (ref 31.5–35.7)
MCV: 92 fL (ref 79–97)
Monocytes Absolute: 0.4 10*3/uL (ref 0.1–0.9)
Monocytes: 8 %
Neutrophils Absolute: 2.7 10*3/uL (ref 1.4–7.0)
Neutrophils: 59 %
Platelets: 253 10*3/uL (ref 150–450)
RBC: 4.35 x10E6/uL (ref 3.77–5.28)
RDW: 12.5 % (ref 11.7–15.4)
WBC: 4.7 10*3/uL (ref 3.4–10.8)

## 2020-03-03 LAB — LIPID PANEL
Chol/HDL Ratio: 2.4 ratio (ref 0.0–4.4)
Cholesterol, Total: 205 mg/dL — ABNORMAL HIGH (ref 100–199)
HDL: 86 mg/dL (ref >39)
LDL Chol Calc (NIH): 106 mg/dL — ABNORMAL HIGH (ref 0–99)
Triglycerides: 73 mg/dL (ref 0–149)
VLDL Cholesterol Cal: 13 mg/dL (ref 5–40)

## 2020-03-03 LAB — COMPREHENSIVE METABOLIC PANEL
ALT: 17 IU/L (ref 0–32)
AST: 20 IU/L (ref 0–40)
Albumin/Globulin Ratio: 2.2 (ref 1.2–2.2)
Albumin: 4.6 g/dL (ref 3.8–4.8)
Alkaline Phosphatase: 72 IU/L (ref 44–121)
BUN/Creatinine Ratio: 23 (ref 12–28)
BUN: 16 mg/dL (ref 8–27)
Bilirubin Total: 0.4 mg/dL (ref 0.0–1.2)
CO2: 24 mmol/L (ref 20–29)
Calcium: 9.2 mg/dL (ref 8.7–10.3)
Chloride: 102 mmol/L (ref 96–106)
Creatinine, Ser: 0.7 mg/dL (ref 0.57–1.00)
GFR calc Af Amer: 102 mL/min/{1.73_m2} (ref >59)
GFR calc non Af Amer: 89 mL/min/{1.73_m2} (ref >59)
Globulin, Total: 2.1 g/dL (ref 1.5–4.5)
Glucose: 93 mg/dL (ref 65–99)
Potassium: 4.6 mmol/L (ref 3.5–5.2)
Sodium: 138 mmol/L (ref 134–144)
Total Protein: 6.7 g/dL (ref 6.0–8.5)

## 2020-03-03 LAB — TSH: TSH: 2.58 u[IU]/mL (ref 0.450–4.500)

## 2020-03-03 LAB — CARDIOVASCULAR RISK ASSESSMENT

## 2020-03-08 ENCOUNTER — Ambulatory Visit (INDEPENDENT_AMBULATORY_CARE_PROVIDER_SITE_OTHER): Payer: PPO | Admitting: Family Medicine

## 2020-03-08 ENCOUNTER — Other Ambulatory Visit: Payer: Self-pay

## 2020-03-08 VITALS — BP 122/76 | HR 64 | Temp 97.2°F | Ht 61.0 in | Wt 124.0 lb

## 2020-03-08 DIAGNOSIS — E782 Mixed hyperlipidemia: Secondary | ICD-10-CM | POA: Diagnosis not present

## 2020-03-08 DIAGNOSIS — N951 Menopausal and female climacteric states: Secondary | ICD-10-CM | POA: Diagnosis not present

## 2020-03-08 DIAGNOSIS — K219 Gastro-esophageal reflux disease without esophagitis: Secondary | ICD-10-CM | POA: Diagnosis not present

## 2020-03-08 NOTE — Patient Instructions (Signed)
High Cholesterol  High cholesterol is a condition in which the blood has high levels of a white, waxy, fat-like substance (cholesterol). The human body needs small amounts of cholesterol. The liver makes all the cholesterol that the body needs. Extra (excess) cholesterol comes from the food that we eat. Cholesterol is carried from the liver by the blood through the blood vessels. If you have high cholesterol, deposits (plaques) may build up on the walls of your blood vessels (arteries). Plaques make the arteries narrower and stiffer. Cholesterol plaques increase your risk for heart attack and stroke. Work with your health care provider to keep your cholesterol levels in a healthy range. What increases the risk? This condition is more likely to develop in people who:  Eat foods that are high in animal fat (saturated fat) or cholesterol.  Are overweight.  Are not getting enough exercise.  Have a family history of high cholesterol. What are the signs or symptoms? There are no symptoms of this condition. How is this diagnosed? This condition may be diagnosed from the results of a blood test.  If you are older than age 20, your health care provider may check your cholesterol every 4-6 years.  You may be checked more often if you already have high cholesterol or other risk factors for heart disease. The blood test for cholesterol measures:  "Bad" cholesterol (LDL cholesterol). This is the main type of cholesterol that causes heart disease. The desired level for LDL is less than 100.  "Good" cholesterol (HDL cholesterol). This type helps to protect against heart disease by cleaning the arteries and carrying the LDL away. The desired level for HDL is 60 or higher.  Triglycerides. These are fats that the body can store or burn for energy. The desired number for triglycerides is lower than 150.  Total cholesterol. This is a measure of the total amount of cholesterol in your blood, including LDL  cholesterol, HDL cholesterol, and triglycerides. A healthy number is less than 200. How is this treated? This condition is treated with diet changes, lifestyle changes, and medicines. Diet changes  This may include eating more whole grains, fruits, vegetables, nuts, and fish.  This may also include cutting back on red meat and foods that have a lot of added sugar. Lifestyle changes  Changes may include getting at least 40 minutes of aerobic exercise 3 times a week. Aerobic exercises include walking, biking, and swimming. Aerobic exercise along with a healthy diet can help you maintain a healthy weight.  Changes may also include quitting smoking. Medicines  Medicines are usually given if diet and lifestyle changes have failed to reduce your cholesterol to healthy levels.  Your health care provider may prescribe a statin medicine. Statin medicines have been shown to reduce cholesterol, which can reduce the risk of heart disease. Follow these instructions at home: Eating and drinking If told by your health care provider:  Eat chicken (without skin), fish, veal, shellfish, ground turkey breast, and round or loin cuts of red meat.  Do not eat fried foods or fatty meats, such as hot dogs and salami.  Eat plenty of fruits, such as apples.  Eat plenty of vegetables, such as broccoli, potatoes, and carrots.  Eat beans, peas, and lentils.  Eat grains such as barley, rice, couscous, and bulgur wheat.  Eat pasta without cream sauces.  Use skim or nonfat milk, and eat low-fat or nonfat yogurt and cheeses.  Do not eat or drink whole milk, cream, ice cream, egg yolks,   or hard cheeses.  Do not eat stick margarine or tub margarines that contain trans fats (also called partially hydrogenated oils).  Do not eat saturated tropical oils, such as coconut oil and palm oil.  Do not eat cakes, cookies, crackers, or other baked goods that contain trans fats.  General instructions  Exercise as  directed by your health care provider. Increase your activity level with activities such as gardening, walking, and taking the stairs.  Take over-the-counter and prescription medicines only as told by your health care provider.  Do not use any products that contain nicotine or tobacco, such as cigarettes and e-cigarettes. If you need help quitting, ask your health care provider.  Keep all follow-up visits as told by your health care provider. This is important. Contact a health care provider if:  You are struggling to maintain a healthy diet or weight.  You need help to start on an exercise program.  You need help to stop smoking. Get help right away if:  You have chest pain.  You have trouble breathing. This information is not intended to replace advice given to you by your health care provider. Make sure you discuss any questions you have with your health care provider. Document Revised: 03/01/2017 Document Reviewed: 08/27/2015 Elsevier Patient Education  2020 [employer] Preventing High Cholesterol Cholesterol is a white, waxy substance similar to fat that the human body needs to help build cells. The liver makes all the cholesterol that a person's body needs. Having high cholesterol (hypercholesterolemia) increases a person's risk for heart disease and stroke. Extra (excess) cholesterol comes from the food the person eats. High cholesterol can often be prevented with diet and lifestyle changes. If you already have high cholesterol, you can control it with diet and lifestyle changes and with medicine. How can high cholesterol affect me? If you have high cholesterol, deposits (plaques) may build up on the walls of your arteries. The arteries are the blood vessels that carry blood away from your heart. Plaques make the arteries narrower and stiffer. This can limit or block blood flow and cause blood clots to form. Blood clots:  Are tiny balls of cells that form in your blood.  Can  move to the heart or brain, causing a heart attack or stroke. Plaques in arteries greatly increase your risk for heart attack and stroke.Making diet and lifestyle changes can reduce your risk for these conditions that may threaten your life. What can increase my risk? This condition is more likely to develop in people who:  Eat foods that are high in saturated fat or cholesterol. Saturated fat is mostly found in: ? Foods that contain animal fat, such as red meat and some dairy products. ? Certain fatty foods made from plants, such as tropical oils.  Are overweight.  Are not getting enough exercise.  Have a family history of high cholesterol. What actions can I take to prevent this? Nutrition   Eat less saturated fat.  Avoid trans fats (partially hydrogenated oils). These are often found in margarine and in some baked goods, fried foods, and snacks bought in packages.  Avoid precooked or cured meat, such as sausages or meat loaves.  Avoid foods and drinks that have added sugars.  Eat more fruits, vegetables, and whole grains.  Choose healthy sources of protein, such as fish, poultry, lean cuts of red meat, beans, peas, lentils, and nuts.  Choose healthy sources of fat, such as: ? Nuts. ? Vegetable oils, especially olive oil. ? Fish that  have healthy fats (omega-3 fatty acids), such as mackerel or salmon. The items listed above may not be a complete list of recommended foods and beverages. Contact a dietitian for more information. Lifestyle  Lose weight if you are overweight. Losing 5-10 lb (2.3-4.5 kg) can help prevent or control high cholesterol. It can also lower your risk for diabetes and high blood pressure. Ask your health care provider to help you with a diet and exercise plan to lose weight safely.  Do not use any products that contain nicotine or tobacco, such as cigarettes, e-cigarettes, and chewing tobacco. If you need help quitting, ask your health care  provider.  Limit your alcohol intake. ? Do not drink alcohol if:  Your health care provider tells you not to drink.  You are pregnant, may be pregnant, or are planning to become pregnant. ? If you drink alcohol:  Limit how much you use to:  0-1 drink a day for women.  0-2 drinks a day for men.  Be aware of how much alcohol is in your drink. In the [place] one drink equals one 12 oz bottle of beer (355 mL), one 5 oz glass of wine (148 mL), or one 1 oz glass of hard liquor (44 mL). Activity   Get enough exercise. Each week, do at least 150 minutes of exercise that takes a medium level of effort (moderate-intensity exercise). ? This is exercise that:  Makes your heart beat faster and makes you breathe harder than usual.  Allows you to still be able to talk. ? You could exercise in short sessions several times a day or longer sessions a few times a week. For example, on 5 days each week, you could walk fast or ride your bike 3 times a day for 10 minutes each time.  Do exercises as told by your health care provider. Medicines  In addition to diet and lifestyle changes, your health care provider may recommend medicines to help lower cholesterol. This may be a medicine to lower the amount of cholesterol your liver makes. You may need medicine if: ? Diet and lifestyle changes do not lower your cholesterol enough. ? You have high cholesterol and other risk factors for heart disease or stroke.  Take over-the-counter and prescription medicines only as told by your health care provider. General information  Manage your risk factors for high cholesterol. Talk with your health care provider about all your risk factors and how to lower your risk.  Manage other conditions that you have, such as diabetes or high blood pressure (hypertension).  Have blood tests to check your cholesterol levels at regular points in time as told by your health care provider.  Keep all follow-up visits as told  by your health care provider. This is important. Where to find more information  American Heart Association: www.heart.org  National Heart, Lung, and Blood Institute: https://wilson-eaton.com/ Summary  High cholesterol increases your risk for heart disease and stroke. By keeping your cholesterol level low, you can reduce your risk for these conditions.  High cholesterol can often be prevented with diet and lifestyle changes.  Work with your health care provider to manage your risk factors, and have your blood tested regularly. This information is not intended to replace advice given to you by your health care provider. Make sure you discuss any questions you have with your health care provider. Document Revised: 06/20/2018 Document Reviewed: 11/05/2015 Elsevier Patient Education  2020 [employer].

## 2020-03-08 NOTE — Progress Notes (Signed)
Subjective:  Patient ID: Maria Allison, female    DOB: 1950/06/26  Age: 69 y.o. MRN: [id number]  Chief Complaint  Patient presents with  . Hyperlipidemia  . Gastroesophageal Reflux  . Hot Flashes  . Arthritis    HPI Arthritis-taking meloxicam GERD-Omeprazole. Well controlled.  hyperlipidemia-Crestor and fish oil. Low fat diet and exercing. Recent labs showed cholesterol at goal. Reviewed with patient.  Hot flashes - taking Zoloft which helps.   Current Outpatient Medications on File Prior to Visit  Medication Sig Dispense Refill  . aspirin 81 MG tablet Take 81 mg by mouth daily.    . Calcium Carbonate-Vitamin D (CALCIUM-VITAMIN D3 PO) Take by mouth daily.    . Coenzyme Q10 (COQ-10 PO) Take 1 tablet by mouth daily.    Marland Kitchen desonide (DESOWEN) 0.05 % ointment Can apply to eyelids once daily if needed. 15 g 5  . EPINEPHrine 0.3 mg/0.3 mL IJ SOAJ injection Use as directed for life-threatening allergic reaction. 1 each 3  . Fish Oil OIL Take 1 tablet by mouth daily.    . meloxicam (MOBIC) 7.5 MG tablet Take 1 tablet (7.5 mg total) by mouth daily. 90 tablet 0  . montelukast (SINGULAIR) 10 MG tablet Take 1 tablet (10 mg total) by mouth at bedtime. 30 tablet 5  . omeprazole (PRILOSEC) 40 MG capsule Take 1 capsule (40 mg total) by mouth daily. 90 capsule 1  . Polyethyl Glycol-Propyl Glycol (SYSTANE) 0.4-0.3 % SOLN Apply to eye.    . rosuvastatin (CRESTOR) 20 MG tablet TAKE 1 TABLET(20 MG) BY MOUTH EVERY DAY 90 tablet 0  . sertraline (ZOLOFT) 50 MG tablet TAKE 1 TABLET BY MOUTH EVERY DAY 90 tablet 0   No current facility-administered medications on file prior to visit.   Past Medical History:  Diagnosis Date  . Chest pain   . Depression   . GERD (gastroesophageal reflux disease)   . Hyperlipemia   . Osteoporosis    Past Surgical History:  Procedure Laterality Date  . ABDOMINAL EXPLORATION SURGERY    . BREAST BIOPSY  1997  . CARDIAC CATHETERIZATION    . LUMBAR DISC SURGERY    .  TONSILLECTOMY      Family History  Problem Relation Age of Onset  . Thrombosis Other        There is a family history of portal vein thrombosis for which family screening has been recommended at [facility]  . Leukemia Mother   . Heart disease Father   . Kidney cancer Father   . Heart disease Maternal Grandmother   . Ulcerative colitis Maternal Grandfather   . Lung cancer Paternal Grandmother   . Liver cancer Paternal Grandmother   . Heart disease Brother   . Heart attack Brother    Social History   Socioeconomic History  . Marital status: Married    Spouse name: Not on file  . Number of children: 1  . Years of education: Not on file  . Highest education level: Some college, no degree  Occupational History  . Occupation: Retired  Tobacco Use  . Smoking status: Never Smoker  . Smokeless tobacco: Never Used  Vaping Use  . Vaping Use: Never used  Substance and Sexual Activity  . Alcohol use: Yes    Alcohol/week: 1.0 standard drink    Types: 1 Glasses of wine per week    Comment: five times per week  . Drug use: No  . Sexual activity: Yes    Partners: Male  Birth control/protection: None  Other Topics Concern  . Not on file  Social History Narrative   The patient lives in [place] with her husband.    She has one daughter who is in college at [employer]    She admits to just occasional alcohol use.  She denies any tobacco    abuse.         Social Determinants of Health   Financial Resource Strain: Not on file  Food Insecurity: Not on file  Transportation Needs: No Transportation Needs  . Lack of Transportation (Medical): No  . Lack of Transportation (Non-Medical): No  Physical Activity: Sufficiently Active  . Days of Exercise per Week: 5 days  . Minutes of Exercise per Session: 150+ min  Stress: Not on file  Social Connections: Not on file    Review of Systems  Constitutional: Negative for chills, fatigue and fever.  HENT: Negative for  congestion, ear pain, rhinorrhea and sore throat.   Respiratory: Negative for cough and shortness of breath.   Cardiovascular: Negative for chest pain.  Gastrointestinal: Negative for abdominal pain, constipation, diarrhea, nausea and vomiting.  Genitourinary: Negative for dysuria and urgency.  Musculoskeletal: Positive for arthralgias. Negative for back pain and myalgias.  Neurological: Negative for dizziness, weakness, light-headedness and headaches.  Psychiatric/Behavioral: Negative for dysphoric mood. The patient is not nervous/anxious.      Objective:  BP 122/76   Pulse 64   Temp (!) 97.2 F (36.2 C)   Ht 5\' 1"  (1.549 m)   Wt 124 lb (56.2 kg)   SpO2 98%   BMI 23.43 kg/m   BP/Weight 03/08/2020 03/02/2020 99991111  Systolic BP [id number]  Diastolic BP 76 70 76  Wt. (Lbs) 124 123 122.6  BMI 23.43 23.24 23.17    Physical Exam Vitals reviewed.  Constitutional:      Appearance: Normal appearance.  HENT:     Head: Normocephalic.     Right Ear: Tympanic membrane, ear canal and external ear normal.     Left Ear: Tympanic membrane, ear canal and external ear normal.  Neck:     Vascular: No carotid bruit.  Cardiovascular:     Rate and Rhythm: Normal rate and regular rhythm.     Pulses: Normal pulses.     Heart sounds: Normal heart sounds.  Pulmonary:     Effort: Pulmonary effort is normal.     Breath sounds: Normal breath sounds.  Abdominal:     General: Bowel sounds are normal.     Palpations: Abdomen is soft.     Tenderness: There is no abdominal tenderness.  Neurological:     Mental Status: She is alert and oriented to person, place, and time.  Psychiatric:        Mood and Affect: Mood normal.        Behavior: Behavior normal.     Lab Results  Component Value Date   WBC 4.7 03/02/2020   HGB 13.2 03/02/2020   HCT 40.0 03/02/2020   PLT 253 03/02/2020   GLUCOSE 93 03/02/2020   CHOL 205 (H) 03/02/2020   TRIG 73 03/02/2020   HDL 86 03/02/2020   LDLCALC  106 (H) 03/02/2020   ALT 17 03/02/2020   AST 20 03/02/2020   NA 138 03/02/2020   K 4.6 03/02/2020   CL 102 03/02/2020   CREATININE 0.70 03/02/2020   BUN 16 03/02/2020   CO2 24 03/02/2020   TSH 2.580 03/02/2020      Assessment & Plan:  1. Mixed hyperlipidemia Well controlled.  No changes to medicines.  Continue to work on eating a healthy diet and exercise.   2. Gastroesophageal reflux disease without esophagitis The current medical regimen is effective;  continue present plan and medications.  3. Menopausal and female climacteric states  The current medical regimen is effective;  continue present plan and medications. Consider decrease in zoloft if wishes.  Follow-up: Return in about 3 months (around 06/06/2020).  An After Visit Summary was printed and given to the patient.  Rochel Brome, MD [facility] [phone]

## 2020-03-09 ENCOUNTER — Encounter: Payer: Self-pay | Admitting: Family Medicine

## 2020-03-12 ENCOUNTER — Other Ambulatory Visit: Payer: Self-pay | Admitting: Family Medicine

## 2020-03-13 ENCOUNTER — Encounter: Payer: Self-pay | Admitting: Family Medicine

## 2020-03-13 MED ORDER — MELOXICAM 7.5 MG PO TABS
7.5000 mg | ORAL_TABLET | Freq: Every day | ORAL | 3 refills | Status: DC
Start: 2020-03-13 — End: 2021-02-09

## 2020-03-13 MED ORDER — MONTELUKAST SODIUM 10 MG PO TABS: 10.0000 mg | ORAL_TABLET | Freq: Every day | ORAL | 3 refills | Status: DC

## 2020-03-13 MED ORDER — SERTRALINE HCL 50 MG PO TABS: 50.0000 mg | ORAL_TABLET | Freq: Every day | ORAL | 3 refills | Status: DC

## 2020-03-13 MED ORDER — ROSUVASTATIN CALCIUM 20 MG PO TABS: ORAL_TABLET | ORAL | 3 refills | Status: DC

## 2020-04-08 ENCOUNTER — Other Ambulatory Visit: Payer: Self-pay | Admitting: Allergy and Immunology

## 2020-04-28 DIAGNOSIS — Z01419 Encounter for gynecological examination (general) (routine) without abnormal findings: Secondary | ICD-10-CM | POA: Diagnosis not present

## 2020-04-28 DIAGNOSIS — Z1231 Encounter for screening mammogram for malignant neoplasm of breast: Secondary | ICD-10-CM | POA: Diagnosis not present

## 2020-04-28 LAB — HM MAMMOGRAPHY

## 2020-05-09 ENCOUNTER — Other Ambulatory Visit: Payer: Self-pay | Admitting: Physician Assistant

## 2020-05-19 DIAGNOSIS — D2239 Melanocytic nevi of other parts of face: Secondary | ICD-10-CM | POA: Diagnosis not present

## 2020-05-19 DIAGNOSIS — L814 Other melanin hyperpigmentation: Secondary | ICD-10-CM | POA: Diagnosis not present

## 2020-05-19 DIAGNOSIS — L820 Inflamed seborrheic keratosis: Secondary | ICD-10-CM | POA: Diagnosis not present

## 2020-05-19 DIAGNOSIS — D225 Melanocytic nevi of trunk: Secondary | ICD-10-CM | POA: Diagnosis not present

## 2020-05-19 DIAGNOSIS — D1801 Hemangioma of skin and subcutaneous tissue: Secondary | ICD-10-CM | POA: Diagnosis not present

## 2020-08-03 ENCOUNTER — Encounter: Payer: Self-pay | Admitting: Family Medicine

## 2020-09-21 ENCOUNTER — Telehealth (INDEPENDENT_AMBULATORY_CARE_PROVIDER_SITE_OTHER): Payer: PPO | Admitting: Physician Assistant

## 2020-09-21 ENCOUNTER — Encounter: Payer: Self-pay | Admitting: Physician Assistant

## 2020-09-21 VITALS — Ht 61.0 in | Wt 120.0 lb

## 2020-09-21 DIAGNOSIS — J060 Acute laryngopharyngitis: Secondary | ICD-10-CM | POA: Diagnosis not present

## 2020-09-21 MED ORDER — GENTAMICIN SULFATE 0.3 % OP SOLN
1.0000 [drp] | Freq: Three times a day (TID) | OPHTHALMIC | 0 refills | Status: DC
Start: 2020-09-21 — End: 2021-03-07

## 2020-09-21 MED ORDER — AMOXICILLIN 875 MG PO TABS: 875.0000 mg | ORAL_TABLET | Freq: Two times a day (BID) | ORAL | 0 refills | Status: AC

## 2020-09-21 NOTE — Progress Notes (Signed)
Virtual Visit via Telephone Note   This visit type was conducted due to national recommendations for restrictions regarding the COVID-19 Pandemic (e.g. social distancing) in an effort to limit this patient's exposure and mitigate transmission in our community.  Due to her co-morbid illnesses, this patient is at least at moderate risk for complications without adequate follow up.  This format is felt to be most appropriate for this patient at this time.  The patient did not have access to video technology/had technical difficulties with video requiring transitioning to audio format only (telephone).  All issues noted in this document were discussed and addressed.  No physical exam could be performed with this format.  Patient verbally consented to a telehealth visit.   Date:  09/21/2020   ID:  Maria Allison, DOB Jul 18, 1950, MRN [id number]  Patient Location: Home Provider Location: Office  PCP:  Rochel Brome, MD    Chief Complaint:  URI  History of Present Illness:    Maria Allison is a 70 y.o. female with URI symptoms - states for the past 2 weeks she has had some mild cough and congestion but now is productive of yellow thick phlegm Also last night left eye was matted - improved this morning  The patient does not have symptoms concerning for COVID-19 infection (fever, chills, cough, or new shortness of breath).    Past Medical History:  Diagnosis Date   Chest pain    Depression    GERD (gastroesophageal reflux disease)    Hyperlipemia    Osteoporosis    Past Surgical History:  Procedure Laterality Date   ABDOMINAL EXPLORATION SURGERY     BREAST BIOPSY  1997   CARDIAC CATHETERIZATION     LUMBAR DISC SURGERY     TONSILLECTOMY       Current Meds  Medication Sig   amoxicillin (AMOXIL) 875 MG tablet Take 1 tablet (875 mg total) by mouth 2 (two) times daily for 10 days.   aspirin 81 MG tablet Take 81 mg by mouth daily.   Calcium Carbonate-Vitamin D (CALCIUM-VITAMIN D3  PO) Take by mouth daily.   Coenzyme Q10 (COQ-10 PO) Take 1 tablet by mouth daily.   desonide (DESOWEN) 0.05 % ointment Can apply to eyelids once daily if needed.   EPINEPHrine 0.3 mg/0.3 mL IJ SOAJ injection Use as directed for life-threatening allergic reaction.   finasteride (PROSCAR) 5 MG tablet Take 2.5 mg by mouth daily.   Fish Oil OIL Take 1 tablet by mouth daily.   gentamicin (GARAMYCIN) 0.3 % ophthalmic solution Place 1 drop into the left eye 3 (three) times daily.   meloxicam (MOBIC) 7.5 MG tablet Take 1 tablet (7.5 mg total) by mouth daily.   meloxicam (MOBIC) 7.5 MG tablet TAKE 1 TABLET(7.5 MG) BY MOUTH DAILY   montelukast (SINGULAIR) 10 MG tablet Take 1 tablet (10 mg total) by mouth at bedtime.   omeprazole (PRILOSEC) 40 MG capsule TAKE 1 CAPSULE(40 MG) BY MOUTH DAILY   Polyethyl Glycol-Propyl Glycol (SYSTANE) 0.4-0.3 % SOLN Apply to eye.   rosuvastatin (CRESTOR) 20 MG tablet TAKE 1 TABLET(20 MG) BY MOUTH EVERY DAY   sertraline (ZOLOFT) 50 MG tablet TAKE 1 TABLET BY MOUTH EVERY DAY     Allergies:   Other   Social History   Tobacco Use   Smoking status: Never   Smokeless tobacco: Never  Vaping Use   Vaping Use: Never used  Substance Use Topics   Alcohol use: Yes    Alcohol/week: 1.0 standard  drink    Types: 1 Glasses of wine per week    Comment: five times per week   Drug use: No     Family Hx: The patient's family history includes Heart attack in her brother; Heart disease in her brother, father, and maternal grandmother; Kidney cancer in her father; Leukemia in her mother; Liver cancer in her paternal grandmother; Lung cancer in her paternal grandmother; Thrombosis in an other family member; Ulcerative colitis in her maternal grandfather.  ROS:   Please see the history of present illness.    All other systems reviewed and are negative.  Labs/Other Tests and Data Reviewed:    Recent Labs: 03/02/2020: ALT 17; BUN 16; Creatinine, Ser 0.70; Hemoglobin 13.2;  Platelets 253; Potassium 4.6; Sodium 138; TSH 2.580   Recent Lipid Panel Lab Results  Component Value Date/Time   CHOL 205 (H) 03/02/2020 01:56 PM   TRIG 73 03/02/2020 01:56 PM   HDL 86 03/02/2020 01:56 PM   CHOLHDL 2.4 03/02/2020 01:56 PM   CHOLHDL 2.5 02/15/2016 09:04 AM   LDLCALC 106 (H) 03/02/2020 01:56 PM    Wt Readings from Last 3 Encounters:  09/21/20 120 lb (54.4 kg)  03/08/20 124 lb (56.2 kg)  03/02/20 123 lb (55.8 kg)     Objective:    Vital Signs:  Ht 5\' 1"  (1.549 m)   Wt 120 lb (54.4 kg)   BMI 22.67 kg/m    VITAL SIGNS:  reviewed GEN:  no acute distress  ASSESSMENT & PLAN:    URI - rx for amoxicillin and genoptic - recommend mucinex as needed  COVID-19 Education: The signs and symptoms of COVID-19 were discussed with the patient and how to seek care for testing (follow up with PCP or arrange E-visit). The importance of social distancing was discussed today.  Time:   Today, I have spent 10 minutes with the patient with telehealth technology discussing the above problems.     Medication Adjustments/Labs and Tests Ordered: Current medicines are reviewed at length with the patient today.  Concerns regarding medicines are outlined above.   Tests Ordered: No orders of the defined types were placed in this encounter.   Medication Changes: Meds ordered this encounter  Medications   amoxicillin (AMOXIL) 875 MG tablet    Sig: Take 1 tablet (875 mg total) by mouth 2 (two) times daily for 10 days.    Dispense:  20 tablet    Refill:  0    Order Specific Question:   Supervising Provider    Answer:   Shelton Silvas   gentamicin (GARAMYCIN) 0.3 % ophthalmic solution    Sig: Place 1 drop into the left eye 3 (three) times daily.    Dispense:  5 mL    Refill:  0    Order Specific Question:   Supervising Provider    AnswerShelton Silvas    Follow Up:  In Person prn  Signed, Webb Silversmith, PA-C  09/21/2020 11:42 AM    [place]

## 2020-11-11 ENCOUNTER — Telehealth: Payer: Self-pay

## 2020-11-11 NOTE — Chronic Care Management (AMB) (Signed)
Spoke with patient in reference to gap in medication fill on her rosuvastatin that was last filled 08-02-2020 for a 90 day supply. Patient wasn't at home and asked for me to call her pharmacy to follow up. Pharmacy informed me that rosuvastatin ha been filled and is ready for pickup. Called patient back to inform her and she stated she will pick medication up today.  [place] Pharmacist Assistant [phone]

## 2020-11-22 ENCOUNTER — Other Ambulatory Visit: Payer: Self-pay

## 2020-11-22 MED ORDER — OMEPRAZOLE 40 MG PO CPDR: DELAYED_RELEASE_CAPSULE | ORAL | 1 refills | Status: DC

## 2021-01-17 ENCOUNTER — Other Ambulatory Visit: Payer: Self-pay | Admitting: Family Medicine

## 2021-01-17 ENCOUNTER — Other Ambulatory Visit: Payer: Self-pay

## 2021-01-17 MED ORDER — SERTRALINE HCL 50 MG PO TABS: 50.0000 mg | ORAL_TABLET | Freq: Every day | ORAL | 0 refills | Status: DC

## 2021-02-09 ENCOUNTER — Ambulatory Visit (INDEPENDENT_AMBULATORY_CARE_PROVIDER_SITE_OTHER): Payer: PPO | Admitting: Family Medicine

## 2021-02-09 ENCOUNTER — Other Ambulatory Visit: Payer: Self-pay

## 2021-02-09 VITALS — BP 128/72 | HR 72 | Temp 97.5°F | Resp 14 | Wt 125.0 lb

## 2021-02-09 DIAGNOSIS — Z23 Encounter for immunization: Secondary | ICD-10-CM

## 2021-02-09 DIAGNOSIS — M25561 Pain in right knee: Secondary | ICD-10-CM | POA: Insufficient documentation

## 2021-02-09 MED ORDER — MELOXICAM 15 MG PO TABS: 15.0000 mg | ORAL_TABLET | Freq: Every day | ORAL | 3 refills | Status: DC

## 2021-02-09 NOTE — Assessment & Plan Note (Signed)
Increase meloxicam to 15 mg daily.  Get knee xray.  If not improving, I would consider doing a steroid injection in your knee and/or referring to physical therapy. At some point if conservative treatments are not helping, we may need to get an mri of your knee.

## 2021-02-09 NOTE — Patient Instructions (Signed)
Increase meloxicam to 15 mg daily.  Get knee xray.  If not improving, I would consider doing a steroid injection in your knee and/or referring to physical therapy. At some point if conservative treatments are not helping, we may need to get an mri of your knee.

## 2021-02-09 NOTE — Progress Notes (Signed)
Subjective:  Patient ID: Maria Allison, female    DOB: 02/25/1951  Age: 70 y.o. MRN: [id number]  Chief Complaint  Patient presents with   Knee Pain    Right     HPI Rt knee pain hurts every day. Knee catches and then will give out on her   She was traveling and felt tightness posterior right leg within the last month. This has resolved.   Was helping her friend paint the inside of her house in September and had issues with her right knee, this has resolved.   In March was sitting on a high chair and when she came off of it, she noted her lower leg was swollen, but has resolved.   Pt has taken aleve at times. She is also currently taking meloxicam 7.5 mg once daily.   Current Outpatient Medications on File Prior to Visit  Medication Sig Dispense Refill   aspirin 81 MG tablet Take 81 mg by mouth daily.     Calcium Carbonate-Vitamin D (CALCIUM-VITAMIN D3 PO) Take by mouth daily.     Coenzyme Q10 (COQ-10 PO) Take 1 tablet by mouth daily.     desonide (DESOWEN) 0.05 % ointment Can apply to eyelids once daily if needed. 15 g 5   EPINEPHrine 0.3 mg/0.3 mL IJ SOAJ injection Use as directed for life-threatening allergic reaction. 1 each 3   finasteride (PROSCAR) 5 MG tablet Take 2.5 mg by mouth daily.     Fish Oil OIL Take 1 tablet by mouth daily.     gentamicin (GARAMYCIN) 0.3 % ophthalmic solution Place 1 drop into the left eye 3 (three) times daily. 5 mL 0   montelukast (SINGULAIR) 10 MG tablet Take 1 tablet (10 mg total) by mouth at bedtime. 90 tablet 3   omeprazole (PRILOSEC) 40 MG capsule TAKE 1 CAPSULE(40 MG) BY MOUTH DAILY 90 capsule 1   Polyethyl Glycol-Propyl Glycol (SYSTANE) 0.4-0.3 % SOLN Apply to eye.     rosuvastatin (CRESTOR) 20 MG tablet TAKE 1 TABLET(20 MG) BY MOUTH EVERY DAY 90 tablet 3   sertraline (ZOLOFT) 50 MG tablet Take 1 tablet (50 mg total) by mouth daily. 90 tablet 0   No current facility-administered medications on file prior to visit.   Past  Medical History:  Diagnosis Date   Chest pain    Depression    GERD (gastroesophageal reflux disease)    Hyperlipemia    Osteoporosis    Past Surgical History:  Procedure Laterality Date   ABDOMINAL EXPLORATION SURGERY     BREAST BIOPSY  1997   CARDIAC CATHETERIZATION     LUMBAR DISC SURGERY     TONSILLECTOMY      Family History  Problem Relation Age of Onset   Thrombosis Other        There is a family history of portal vein thrombosis for which family screening has been recommended at [facility]   Leukemia Mother    Heart disease Father    Kidney cancer Father    Heart disease Maternal Grandmother    Ulcerative colitis Maternal Grandfather    Lung cancer Paternal Grandmother    Liver cancer Paternal Grandmother    Heart disease Brother    Heart attack Brother    Social History   Socioeconomic History   Marital status: Married    Spouse name: Not on file   Number of children: 1   Years of education: Not on file   Highest education  level: Some college, no degree  Occupational History   Occupation: Retired  Tobacco Use   Smoking status: Never   Smokeless tobacco: Never  Vaping Use   Vaping Use: Never used  Substance and Sexual Activity   Alcohol use: Yes    Alcohol/week: 1.0 standard drink    Types: 1 Glasses of wine per week    Comment: five times per week   Drug use: No   Sexual activity: Yes    Partners: Male    Birth control/protection: None  Other Topics Concern   Not on file  Social History Narrative   The patient lives in [place] with her husband.    She has one daughter who is in college at [employer].    She admits to just occasional alcohol use.  She denies any tobacco    abuse.         Social Determinants of Health   Financial Resource Strain: Not on file  Food Insecurity: Not on file  Transportation Needs: No Transportation Needs   Lack of Transportation (Medical): No   Lack of Transportation (Non-Medical): No  Physical  Activity: Sufficiently Active   Days of Exercise per Week: 5 days   Minutes of Exercise per Session: 150+ min  Stress: Not on file  Social Connections: Not on file    Review of Systems  Constitutional:  Negative for chills and fever.  HENT:  Negative for congestion.   Respiratory:  Negative for cough.   Cardiovascular:  Negative for chest pain.  Musculoskeletal:  Positive for arthralgias (right knee pain).    Objective:  BP 128/72   Pulse 72   Temp (!) 97.5 F (36.4 C)   Resp 14   Wt 125 lb (56.7 kg)   BMI 23.62 kg/m   BP/Weight 02/09/2021 09/21/2020 67/89/3810  Systolic BP 175 - 102  Diastolic BP 72 - 76  Wt. (Lbs) 125 120 124  BMI 23.62 22.67 23.43    Physical Exam Vitals reviewed.  Constitutional:      Appearance: Normal appearance.  Cardiovascular:     Rate and Rhythm: Normal rate and regular rhythm.  Pulmonary:     Effort: Pulmonary effort is normal.     Breath sounds: Normal breath sounds.  Musculoskeletal:        General: No swelling or tenderness. Normal range of motion.     Comments: No ligament laxity. No knee instability  Neurological:     Mental Status: She is alert.    Diabetic Foot Exam - Simple   No data filed      Lab Results  Component Value Date   WBC 4.7 03/02/2020   HGB 13.2 03/02/2020   HCT 40.0 03/02/2020   PLT 253 03/02/2020   GLUCOSE 93 03/02/2020   CHOL 205 (H) 03/02/2020   TRIG 73 03/02/2020   HDL 86 03/02/2020   LDLCALC 106 (H) 03/02/2020   ALT 17 03/02/2020   AST 20 03/02/2020   NA 138 03/02/2020   K 4.6 03/02/2020   CL 102 03/02/2020   CREATININE 0.70 03/02/2020   BUN 16 03/02/2020   CO2 24 03/02/2020   TSH 2.580 03/02/2020      Assessment & Plan:   Problem List Items Addressed This Visit       Other   Acute pain of right knee - Primary    Increase meloxicam to 15 mg daily.  Get knee xray.  If not improving, I would consider doing a steroid injection in  your knee and/or referring to physical therapy. At  some point if conservative treatments are not helping, we may need to get an mri of your knee.       Relevant Orders   DG Knee Complete 4 Views Right   Other Visit Diagnoses     Need for influenza vaccination       Relevant Orders   Flu Vaccine QUAD High Dose(Fluad) (Completed)     .  Meds ordered this encounter  Medications   meloxicam (MOBIC) 15 MG tablet    Sig: Take 1 tablet (15 mg total) by mouth daily.    Dispense:  90 tablet    Refill:  3    Orders Placed This Encounter  Procedures   DG Knee Complete 4 Views Right   Flu Vaccine QUAD High Dose(Fluad)     Follow-up: Return if symptoms worsen or fail to improve.  An After Visit Summary was printed and given to the patient.  Rochel Brome, MD [facility] [phone]

## 2021-02-12 ENCOUNTER — Encounter: Payer: Self-pay | Admitting: Family Medicine

## 2021-02-13 ENCOUNTER — Other Ambulatory Visit: Payer: Self-pay

## 2021-02-13 DIAGNOSIS — M25561 Pain in right knee: Secondary | ICD-10-CM

## 2021-02-16 ENCOUNTER — Ambulatory Visit: Payer: PPO | Admitting: Family Medicine

## 2021-02-19 ENCOUNTER — Encounter: Payer: Self-pay | Admitting: Family Medicine

## 2021-02-20 ENCOUNTER — Other Ambulatory Visit: Payer: Self-pay | Admitting: Family Medicine

## 2021-02-20 DIAGNOSIS — M25561 Pain in right knee: Secondary | ICD-10-CM

## 2021-02-22 ENCOUNTER — Telehealth: Payer: Self-pay | Admitting: Family Medicine

## 2021-02-22 NOTE — Telephone Encounter (Signed)
° °  Maria Allison has been scheduled for the following appointment:  WHAT: MRI RIGHT KNEE WHERE: [facility] DATE: 02/28/21 TIME: 10:15 AM ARRIVAL TIME  Patient has been made aware.

## 2021-02-28 DIAGNOSIS — M25561 Pain in right knee: Secondary | ICD-10-CM | POA: Diagnosis not present

## 2021-03-07 ENCOUNTER — Encounter: Payer: Self-pay | Admitting: Family Medicine

## 2021-03-07 ENCOUNTER — Other Ambulatory Visit: Payer: Self-pay

## 2021-03-07 ENCOUNTER — Ambulatory Visit (INDEPENDENT_AMBULATORY_CARE_PROVIDER_SITE_OTHER): Payer: PPO | Admitting: Family Medicine

## 2021-03-07 VITALS — BP 148/88 | HR 92 | Temp 97.2°F | Resp 16 | Ht 61.0 in | Wt 123.6 lb

## 2021-03-07 DIAGNOSIS — R030 Elevated blood-pressure reading, without diagnosis of hypertension: Secondary | ICD-10-CM | POA: Insufficient documentation

## 2021-03-07 DIAGNOSIS — Z0001 Encounter for general adult medical examination with abnormal findings: Secondary | ICD-10-CM | POA: Diagnosis not present

## 2021-03-07 DIAGNOSIS — M25561 Pain in right knee: Secondary | ICD-10-CM

## 2021-03-07 DIAGNOSIS — E782 Mixed hyperlipidemia: Secondary | ICD-10-CM

## 2021-03-07 DIAGNOSIS — K219 Gastro-esophageal reflux disease without esophagitis: Secondary | ICD-10-CM

## 2021-03-07 NOTE — Progress Notes (Signed)
Subjective:  Patient ID: Maria Allison, female    DOB: May 13, 1950  Age: 70 y.o. MRN: [id number]  Chief Complaint  Patient presents with   Annual Exam    HPI  Well Adult Physical: Patient here for a comprehensive physical exam.The patient reports problems - rt knee pain (mri recently done and was abnormal.) Do you take any herbs or supplements that were not prescribed by a doctor? yes Are you taking calcium supplements? yes Are you taking aspirin daily? yes  Physical ("At Risk" items are starred): Patient's last physical exam was 1 year ago .  Safety: reviewed ; Patient wears a seat belt, has smoke detectors, has carbon monoxide detectors, practices appropriate gun safety, and wears sunscreen with extended sun exposure. Dental Care: biannual cleanings, brushes and flosses daily. Ophthalmology/Optometry: Annual visit.  Hearing loss: none Vision impairments: none  Hyperlipidemia: on crestor 20 mg daily.  Pt has continue to have rt knee pain. MRI rt knee abnormal.   Fall Risk  03/09/2020 03/02/2020  Falls in the past year? - 0  Number falls in past yr: - 0  Injury with Fall? - 0  Risk for fall due to : No Fall Risks Other (Comment)  Follow up Falls evaluation completed;Falls prevention discussed -     Depression screen [facility] 2/9 03/12/2021 03/02/2020 03/02/2020  Decreased Interest 0 0 0  Down, Depressed, Hopeless 0 0 0  PHQ - 2 Score 0 0 0    Functional Status Survey: Does the patient have difficulty seeing, even when wearing glasses/contacts?: No Does the patient have difficulty concentrating, remembering, or making decisions?: No Does the patient have difficulty walking or climbing stairs?: No Does the patient have difficulty dressing or bathing?: No Does the patient have difficulty doing errands alone such as visiting a doctor's office or shopping?: No  Does the patient have difficulty seeing, even when wearing glasses/contacts?: No Does the patient have difficulty  concentrating, remembering, or making decisions?: No Does the patient have difficulty walking or climbing stairs?: No Does the patient have difficulty dressing or bathing?: No Does the patient have difficulty doing errands alone such as visiting a doctor's office or shopping?: No   Health Maintenance  Topic Date Due   Hepatitis C Screening: USPSTF Recommendation to screen - Ages 61-79 yo.  Never done   Zoster (Shingles) Vaccine (1 of 2) Never done   COVID-19 Vaccine (4 - Booster for Pfizer series) 02/23/2020   Mammogram  04/28/2021   DEXA scan (bone density measurement)  04/29/2021   Colon Cancer Screening  08/24/2024   Tetanus Vaccine  05/29/2027   Pneumonia Vaccine  Completed   Flu Shot  Completed   HPV Vaccine  Aged Out     Social Hx   Social History   Socioeconomic History   Marital status: Married    Spouse name: Not on file   Number of children: 1   Years of education: Not on file   Highest education level: Some college, no degree  Occupational History   Occupation: Retired  Tobacco Use   Smoking status: Never   Smokeless tobacco: Never  Vaping Use   Vaping Use: Never used  Substance and Sexual Activity   Alcohol use: Yes    Alcohol/week: 1.0 standard drink    Types: 1 Glasses of wine per week    Comment: five times per week   Drug use: No   Sexual activity: Yes    Partners: Male    Birth control/protection: None  Other Topics Concern   Not on file  Social History Narrative   The patient lives in [place] with her husband.    She has one daughter who is in college at [employer].    She admits to just occasional alcohol use.  She denies any tobacco    abuse.         Social Determinants of Health   Financial Resource Strain: Not on file  Food Insecurity: Not on file  Transportation Needs: Not on file  Physical Activity: Not on file  Stress: Not on file  Social Connections: Not on file   Past Medical History:  Diagnosis Date    Chest pain    Depression    GERD (gastroesophageal reflux disease)    Hyperlipemia    Osteoporosis    Family History  Problem Relation Age of Onset   Thrombosis Other        There is a family history of portal vein thrombosis for which family screening has been recommended at [facility]   Leukemia Mother    Heart disease Father    Kidney cancer Father    Heart disease Maternal Grandmother    Ulcerative colitis Maternal Grandfather    Lung cancer Paternal Grandmother    Liver cancer Paternal Grandmother    Heart disease Brother    Heart attack Brother     Review of Systems  Constitutional:  Negative for chills, fatigue and fever.  HENT:  Positive for congestion. Negative for rhinorrhea and sore throat.   Respiratory:  Positive for cough. Negative for shortness of breath.   Cardiovascular:  Negative for chest pain.  Gastrointestinal:  Negative for abdominal pain, constipation, diarrhea, nausea and vomiting.  Genitourinary:  Negative for dysuria and urgency.  Musculoskeletal:  Positive for arthralgias (right knee pain). Negative for back pain and myalgias.  Neurological:  Negative for dizziness, weakness, light-headedness and headaches.  Psychiatric/Behavioral:  Negative for dysphoric mood. The patient is not nervous/anxious.     Objective:  BP (!) 148/88    Pulse 92    Temp (!) 97.2 F (36.2 C)    Resp 16    Ht 5\' 1"  (1.549 m)    Wt 123 lb 9.6 oz (56.1 kg)    BMI 23.35 kg/m   BP/Weight 03/07/2021 02/09/2021 08/16/3014  Systolic BP 010 932 -  Diastolic BP 88 72 -  Wt. (Lbs) 123.6 125 120  BMI 23.35 23.62 22.67    Physical Exam Vitals reviewed.  Constitutional:      Appearance: Normal appearance. She is normal weight.  Neck:     Vascular: No carotid bruit.  Cardiovascular:     Rate and Rhythm: Normal rate and regular rhythm.     Heart sounds: Normal heart sounds.  Pulmonary:     Effort: Pulmonary effort is normal. No respiratory distress.     Breath sounds: Normal breath  sounds.  Abdominal:     General: Abdomen is flat. Bowel sounds are normal.     Palpations: Abdomen is soft.     Tenderness: There is no abdominal tenderness.  Neurological:     Mental Status: She is alert and oriented to person, place, and time.  Psychiatric:        Mood and Affect: Mood normal.        Behavior: Behavior normal.    Lab Results  Component Value Date   WBC 5.8 03/07/2021   HGB 13.8 03/07/2021   HCT 39.7 03/07/2021   PLT 227 03/07/2021  GLUCOSE 93 03/07/2021   CHOL 220 (H) 03/07/2021   TRIG 96 03/07/2021   HDL 91 03/07/2021   LDLCALC 112 (H) 03/07/2021   ALT 23 03/07/2021   AST 27 03/07/2021   NA 141 03/07/2021   K 5.0 03/07/2021   CL 104 03/07/2021   CREATININE 0.75 03/07/2021   BUN 13 03/07/2021   CO2 25 03/07/2021   TSH 3.080 03/07/2021      Assessment & Plan:   Problem List Items Addressed This Visit       Digestive   GERD (gastroesophageal reflux disease) - Primary     Other   Hyperlipemia    Recommend increase crestor to 40 mg daily as LDL was not at goal.  Recommend continue to work on eating healthy diet and exercise. Follow up in 3 months fasting.       Relevant Orders   Lipid panel (Completed)   Acute pain of right knee    Abnormal mri. Refer to orthopedics.       Elevated BP without diagnosis of hypertension    Check bp at home . If does not improve, pt should call for visit.       Relevant Orders   CBC with Differential/Platelet (Completed)   Comprehensive metabolic panel (Completed)   TSH (Completed)   Encounter for general adult medical examination with abnormal findings    Things to do to keep yourself healthy  - Exercise at least 30-45 minutes a day, 3-4 days a week.  - Eat a low-fat diet with lots of fruits and vegetables, up to 7-9 servings per day.  - Seatbelts can save your life. Wear them always.  - Smoke detectors on every level of your home, check batteries every year.  - Eye Doctor - have an eye exam every  1-2 years  - Safe sex - if you may be exposed to STDs, use a condom.  - Alcohol -  If you drink, do it moderately, less than 2 drinks per day.  - [place]. Choose someone to speak for you if you are not able.  - Depression is common in our stressful world.If you're feeling down or losing interest in things you normally enjoy, please come in for a visit.  - Violence - If anyone is threatening or hurting you, please call immediately.          No orders of the defined types were placed in this encounter.   These are the goals we discussed:  Goals      Pharmacy Care Plan         This is a list of the screening recommended for you and due dates:  Health Maintenance  Topic Date Due   Hepatitis C Screening: USPSTF Recommendation to screen - Ages 29-79 yo.  Never done   Zoster (Shingles) Vaccine (1 of 2) Never done   COVID-19 Vaccine (4 - Booster for Pfizer series) 02/23/2020   Mammogram  04/28/2021   DEXA scan (bone density measurement)  04/29/2021   Colon Cancer Screening  08/24/2024   Tetanus Vaccine  05/29/2027   Pneumonia Vaccine  Completed   Flu Shot  Completed   HPV Vaccine  Aged Out     AN INDIVIDUALIZED CARE PLAN: was established or reinforced today.   SELF MANAGEMENT: The patient and I together assessed ways to personally work towards obtaining the recommended goals  Support needs The patient and/or family needs were assessed and services were offered and not necessary  at this time.    Follow-up: Return in about 2 days (around 03/09/2021) for Nurse visit bp check .  Rochel Brome, MD [facility] [phone]

## 2021-03-07 NOTE — Assessment & Plan Note (Signed)

## 2021-03-07 NOTE — Patient Instructions (Signed)
Things to do to keep yourself healthy  - Exercise at least 30-45 minutes a day, 3-4 days a week.  - Eat a low-fat diet with lots of fruits and vegetables, up to 7-9 servings per day.  - Seatbelts can save your life. Wear them always.  - Smoke detectors on every level of your home, check batteries every year.  - Eye Doctor - have an eye exam every 1-2 years  - Alcohol -  If you drink, do it moderately, less than 2 drinks per day.  - [place]. Please bring copy. - Depression is common in our stressful world.If you're feeling down or losing interest in things you normally enjoy, please come in for a visit.

## 2021-03-08 ENCOUNTER — Other Ambulatory Visit: Payer: Self-pay | Admitting: Family Medicine

## 2021-03-08 DIAGNOSIS — M25561 Pain in right knee: Secondary | ICD-10-CM

## 2021-03-08 LAB — CBC WITH DIFFERENTIAL/PLATELET
Basophils Absolute: 0 10*3/uL (ref 0.0–0.2)
Basos: 1 %
EOS (ABSOLUTE): 0.2 10*3/uL (ref 0.0–0.4)
Eos: 3 %
Hematocrit: 39.7 % (ref 34.0–46.6)
Hemoglobin: 13.8 g/dL (ref 11.1–15.9)
Immature Grans (Abs): 0 10*3/uL (ref 0.0–0.1)
Immature Granulocytes: 0 %
Lymphocytes Absolute: 1.3 10*3/uL (ref 0.7–3.1)
Lymphs: 22 %
MCH: 30.1 pg (ref 26.6–33.0)
MCHC: 34.8 g/dL (ref 31.5–35.7)
MCV: 87 fL (ref 79–97)
Monocytes Absolute: 0.6 10*3/uL (ref 0.1–0.9)
Monocytes: 11 %
Neutrophils Absolute: 3.7 10*3/uL (ref 1.4–7.0)
Neutrophils: 63 %
Platelets: 227 10*3/uL (ref 150–450)
RBC: 4.59 x10E6/uL (ref 3.77–5.28)
RDW: 11.6 % — ABNORMAL LOW (ref 11.7–15.4)
WBC: 5.8 10*3/uL (ref 3.4–10.8)

## 2021-03-08 LAB — COMPREHENSIVE METABOLIC PANEL
ALT: 23 IU/L (ref 0–32)
AST: 27 IU/L (ref 0–40)
Albumin/Globulin Ratio: 2.1 (ref 1.2–2.2)
Albumin: 4.8 g/dL (ref 3.8–4.8)
Alkaline Phosphatase: 71 IU/L (ref 44–121)
BUN/Creatinine Ratio: 17 (ref 12–28)
BUN: 13 mg/dL (ref 8–27)
Bilirubin Total: 0.4 mg/dL (ref 0.0–1.2)
CO2: 25 mmol/L (ref 20–29)
Calcium: 9.7 mg/dL (ref 8.7–10.3)
Chloride: 104 mmol/L (ref 96–106)
Creatinine, Ser: 0.75 mg/dL (ref 0.57–1.00)
Globulin, Total: 2.3 g/dL (ref 1.5–4.5)
Glucose: 93 mg/dL (ref 70–99)
Potassium: 5 mmol/L (ref 3.5–5.2)
Sodium: 141 mmol/L (ref 134–144)
Total Protein: 7.1 g/dL (ref 6.0–8.5)
eGFR: 86 mL/min/{1.73_m2} (ref >59)

## 2021-03-08 LAB — LIPID PANEL
Chol/HDL Ratio: 2.4 ratio (ref 0.0–4.4)
Cholesterol, Total: 220 mg/dL — ABNORMAL HIGH (ref 100–199)
HDL: 91 mg/dL (ref >39)
LDL Chol Calc (NIH): 112 mg/dL — ABNORMAL HIGH (ref 0–99)
Triglycerides: 96 mg/dL (ref 0–149)
VLDL Cholesterol Cal: 17 mg/dL (ref 5–40)

## 2021-03-08 LAB — TSH: TSH: 3.08 u[IU]/mL (ref 0.450–4.500)

## 2021-03-08 NOTE — Progress Notes (Signed)
Blood count normal.  Liver function normal.  Kidney function normal.  Thyroid function normal.  Cholesterol: total 220. LDL 112. Goal is less than 200 and 100, respectively. Recommend increase crestor to 40 mg daily at night.  Please call if she does not respond to the my chart message to confirm it is ok to increase crestor to 40 mg daily.  Recommend follow up in 3 months to recheck.

## 2021-03-09 ENCOUNTER — Ambulatory Visit: Payer: PPO

## 2021-03-12 ENCOUNTER — Encounter: Payer: Self-pay | Admitting: Family Medicine

## 2021-03-12 NOTE — Assessment & Plan Note (Signed)
Abnormal mri. Refer to orthopedics.

## 2021-03-12 NOTE — Assessment & Plan Note (Signed)
Recommend increase crestor to 40 mg daily as LDL was not at goal.  Recommend continue to work on eating healthy diet and exercise. Follow up in 3 months fasting.

## 2021-03-12 NOTE — Assessment & Plan Note (Signed)
Check bp at home . If does not improve, pt should call for visit.

## 2021-03-14 ENCOUNTER — Other Ambulatory Visit: Payer: Self-pay | Admitting: Family Medicine

## 2021-03-14 ENCOUNTER — Telehealth: Payer: Self-pay

## 2021-03-14 MED ORDER — AMLODIPINE BESYLATE 2.5 MG PO TABS: 2.5000 mg | ORAL_TABLET | Freq: Every day | ORAL | 0 refills | Status: DC

## 2021-03-14 NOTE — Telephone Encounter (Signed)
Recommend start on amlodipine 2.5 mg once daily. If bp is above 150/90 after being on this for 1 week, may increase to 2 daily. kc

## 2021-03-14 NOTE — Telephone Encounter (Signed)
Patient notified.  She will continue to monitor her bp and call us back after starting medication if needed.

## 2021-03-14 NOTE — Telephone Encounter (Signed)
Maria Allison called with concerns about her blood pressure. BP this am was 124/92. BP yesterday was 133/92 and 110/86.  BP Sunday was 120/89 in the morning and 121/86 at bedtime.  She is planning on going out of town for 10 days on Jan. 13.  She wants to make sure that you are ok with these numbers.

## 2021-03-15 ENCOUNTER — Other Ambulatory Visit: Payer: Self-pay

## 2021-03-15 DIAGNOSIS — M25561 Pain in right knee: Secondary | ICD-10-CM

## 2021-04-03 ENCOUNTER — Telehealth: Payer: Self-pay

## 2021-04-03 NOTE — Chronic Care Management (AMB) (Signed)
° ° °  Chronic Care Management Pharmacy Assistant   Name: Maria Allison  MRN: [id number] DOB: Aug 04, 1950  Reason for Encounter: Disease State/ General  Recent office visits:  03-07-2021 Rochel Brome, MD. RDW= 11.6. Cholesterol= 220, LDL= 112. STOP Finasteride, Garamycin solution and Desonide ointment.   02-09-2021 Rochel Brome, MD. Flu vaccine given. INCREASE meloxicam 7.5 mg daily TO 15 mg daily. Orders placed for DG right knee.  Recent consult visits:  02-28-2021 Jennette Banker, MD (Radiology). MRI LOWER EXTREM JT, W/O CONTRAST completed.  Hospital visits:  None in previous 6 months  Medications: Outpatient Encounter Medications as of 04/03/2021  Medication Sig   amLODipine (NORVASC) 2.5 MG tablet Take 1 tablet (2.5 mg total) by mouth daily.   aspirin 81 MG tablet Take 81 mg by mouth daily.   Calcium Carbonate-Vitamin D (CALCIUM-VITAMIN D3 PO) Take by mouth daily.   Coenzyme Q10 (COQ-10 PO) Take 1 tablet by mouth daily.   EPINEPHrine 0.3 mg/0.3 mL IJ SOAJ injection Use as directed for life-threatening allergic reaction.   Fish Oil OIL Take 1 tablet by mouth daily.   meloxicam (MOBIC) 15 MG tablet Take 1 tablet (15 mg total) by mouth daily.   montelukast (SINGULAIR) 10 MG tablet Take 1 tablet (10 mg total) by mouth at bedtime.   omeprazole (PRILOSEC) 40 MG capsule TAKE 1 CAPSULE(40 MG) BY MOUTH DAILY   Polyethyl Glycol-Propyl Glycol (SYSTANE) 0.4-0.3 % SOLN Apply to eye.   rosuvastatin (CRESTOR) 20 MG tablet TAKE 1 TABLET(20 MG) BY MOUTH EVERY DAY   sertraline (ZOLOFT) 50 MG tablet Take 1 tablet (50 mg total) by mouth daily.   No facility-administered encounter medications on file as of 04/03/2021.  Lipid Panel    Component Value Date/Time   CHOL 220 (H) 03/07/2021 1101   TRIG 96 03/07/2021 1101   HDL 91 03/07/2021 1101   LDLCALC 112 (H) 03/07/2021 1101     Recent Office Vitals: BP Readings from Last 3 Encounters:  03/07/21 (!) 148/88  02/09/21 128/72  03/08/20 122/76    Pulse Readings from Last 3 Encounters:  03/07/21 92  02/09/21 72  03/08/20 64    Wt Readings from Last 3 Encounters:  03/07/21 123 lb 9.6 oz (56.1 kg)  02/09/21 125 lb (56.7 kg)  09/21/20 120 lb (54.4 kg)     Kidney Function Lab Results  Component Value Date/Time   CREATININE 0.75 03/07/2021 11:01 AM   CREATININE 0.70 03/02/2020 01:56 PM   GFRNONAA 89 03/02/2020 01:56 PM   GFRAA 102 03/02/2020 01:56 PM    BMP Latest Ref Rng & Units 03/07/2021 03/02/2020 06/27/2008  Glucose 70 - 99 mg/dL 93 93 148(H)  BUN 8 - 27 mg/dL 13 16 11   Creatinine 0.57 - 1.00 mg/dL 0.75 0.70 0.75  BUN/Creat Ratio 12 - 28 17 23  -  Sodium 134 - 144 mmol/L 141 138 139  Potassium 3.5 - 5.2 mmol/L 5.0 4.6 3.7  Chloride 96 - 106 mmol/L 104 102 106  CO2 20 - 29 mmol/L 25 24 25   Calcium 8.7 - 10.3 mg/dL 9.7 9.2 8.9    04-03-2021: Patient stated she is traveling and will have to call back the end of next week.  Care Gaps: Last annual wellness visit? 03-07-2021 Hep C screening overdue Shingrix overdue Covid booster overdue  Star Rating Drugs: Rosuvastatin 20 mg- Last filled 02-01-2021 90 DS  [place] Clinical Pharmacist Assistant [phone]

## 2021-05-02 ENCOUNTER — Other Ambulatory Visit: Payer: Self-pay | Admitting: Family Medicine

## 2021-05-02 NOTE — Telephone Encounter (Signed)
Refill sent to pharmacy.   

## 2021-05-05 ENCOUNTER — Telehealth: Payer: Self-pay

## 2021-05-05 NOTE — Chronic Care Management (AMB) (Signed)
° ° °  Chronic Care Management Pharmacy Assistant   Name: Maria Allison  MRN: [id number] DOB: 1950-06-20   Reason for Encounter: Disease State/ Cholesterol   Recent office visits:  03-07-2021 Rochel Brome, MD. RDW= 11.6. Cholesterol= 220, LDL= 112. STOP Finasteride, Garamycin solution and Desonide ointment.    02-09-2021 Rochel Brome, MD. Flu vaccine given. INCREASE meloxicam 7.5 mg daily TO 15 mg daily. Orders placed for DG right knee.  Recent consult visits:  02-28-2021 Jennette Banker, MD (Radiology). MRI LOWER EXTREM JT, W/O CONTRAST completed.  Hospital visits:  None in previous 6 months  Medications: Outpatient Encounter Medications as of 05/05/2021  Medication Sig   amLODipine (NORVASC) 2.5 MG tablet Take 1 tablet (2.5 mg total) by mouth daily.   aspirin 81 MG tablet Take 81 mg by mouth daily.   Calcium Carbonate-Vitamin D (CALCIUM-VITAMIN D3 PO) Take by mouth daily.   Coenzyme Q10 (COQ-10 PO) Take 1 tablet by mouth daily.   EPINEPHrine 0.3 mg/0.3 mL IJ SOAJ injection Use as directed for life-threatening allergic reaction.   Fish Oil OIL Take 1 tablet by mouth daily.   meloxicam (MOBIC) 15 MG tablet Take 1 tablet (15 mg total) by mouth daily.   montelukast (SINGULAIR) 10 MG tablet TAKE 1 TABLET(10 MG) BY MOUTH AT BEDTIME   omeprazole (PRILOSEC) 40 MG capsule TAKE 1 CAPSULE(40 MG) BY MOUTH DAILY   Polyethyl Glycol-Propyl Glycol (SYSTANE) 0.4-0.3 % SOLN Apply to eye.   rosuvastatin (CRESTOR) 20 MG tablet TAKE 1 TABLET(20 MG) BY MOUTH EVERY DAY   sertraline (ZOLOFT) 50 MG tablet Take 1 tablet (50 mg total) by mouth daily.   No facility-administered encounter medications on file as of 05/05/2021.  Lipid Panel    Component Value Date/Time   CHOL 220 (H) 03/07/2021 1101   TRIG 96 03/07/2021 1101   HDL 91 03/07/2021 1101   LDLCALC 112 (H) 03/07/2021 1101     05-05-2021: 1st attempt left VM 05-08-2021: 2nd attempt left VM 0228-2023: 3rd attempt left VM  Care Gaps: Last  annual wellness visit? 03-02-2020. Next AWV 03-08-2022  Star Rating Drugs: Rosuvastatin 20 mg- Last filled 05-02-2021 90 DS  [place] Clinical Pharmacist Assistant [phone]

## 2021-05-11 DIAGNOSIS — M25561 Pain in right knee: Secondary | ICD-10-CM | POA: Diagnosis not present

## 2021-05-13 ENCOUNTER — Other Ambulatory Visit: Payer: Self-pay | Admitting: Family Medicine

## 2021-05-24 DIAGNOSIS — D2239 Melanocytic nevi of other parts of face: Secondary | ICD-10-CM | POA: Diagnosis not present

## 2021-05-24 DIAGNOSIS — L821 Other seborrheic keratosis: Secondary | ICD-10-CM | POA: Diagnosis not present

## 2021-05-24 DIAGNOSIS — D225 Melanocytic nevi of trunk: Secondary | ICD-10-CM | POA: Diagnosis not present

## 2021-05-24 DIAGNOSIS — L814 Other melanin hyperpigmentation: Secondary | ICD-10-CM | POA: Diagnosis not present

## 2021-05-24 DIAGNOSIS — L820 Inflamed seborrheic keratosis: Secondary | ICD-10-CM | POA: Diagnosis not present

## 2021-05-26 DIAGNOSIS — R1031 Right lower quadrant pain: Secondary | ICD-10-CM | POA: Diagnosis not present

## 2021-05-30 DIAGNOSIS — Z1231 Encounter for screening mammogram for malignant neoplasm of breast: Secondary | ICD-10-CM | POA: Diagnosis not present

## 2021-05-30 LAB — HM MAMMOGRAPHY

## 2021-06-08 ENCOUNTER — Other Ambulatory Visit: Payer: Self-pay | Admitting: Family Medicine

## 2021-09-04 ENCOUNTER — Other Ambulatory Visit: Payer: Self-pay | Admitting: Family Medicine

## 2021-09-04 DIAGNOSIS — H43813 Vitreous degeneration, bilateral: Secondary | ICD-10-CM | POA: Diagnosis not present

## 2021-11-01 ENCOUNTER — Other Ambulatory Visit: Payer: Self-pay

## 2021-11-01 ENCOUNTER — Other Ambulatory Visit: Payer: Self-pay | Admitting: Physician Assistant

## 2021-11-01 ENCOUNTER — Encounter: Payer: Self-pay | Admitting: Physician Assistant

## 2021-11-01 ENCOUNTER — Ambulatory Visit (INDEPENDENT_AMBULATORY_CARE_PROVIDER_SITE_OTHER): Payer: PPO | Admitting: Physician Assistant

## 2021-11-01 VITALS — BP 122/80 | HR 68 | Temp 97.0°F | Ht 61.0 in | Wt 121.0 lb

## 2021-11-01 DIAGNOSIS — R5381 Other malaise: Secondary | ICD-10-CM

## 2021-11-01 DIAGNOSIS — R0789 Other chest pain: Secondary | ICD-10-CM

## 2021-11-01 DIAGNOSIS — R0609 Other forms of dyspnea: Secondary | ICD-10-CM

## 2021-11-01 DIAGNOSIS — R5383 Other fatigue: Secondary | ICD-10-CM | POA: Diagnosis not present

## 2021-11-01 DIAGNOSIS — R079 Chest pain, unspecified: Secondary | ICD-10-CM | POA: Diagnosis not present

## 2021-11-01 DIAGNOSIS — R0602 Shortness of breath: Secondary | ICD-10-CM | POA: Diagnosis not present

## 2021-11-01 LAB — POCT URINALYSIS DIP (CLINITEK)
Bilirubin, UA: NEGATIVE
Blood, UA: NEGATIVE
Glucose, UA: NEGATIVE mg/dL
Ketones, POC UA: NEGATIVE mg/dL
Leukocytes, UA: NEGATIVE
Nitrite, UA: NEGATIVE
POC PROTEIN,UA: NEGATIVE
Spec Grav, UA: <=1.005 — AB (ref 1.010–1.025)
Urobilinogen, UA: 0.2 E.U./dL
pH, UA: 7 (ref 5.0–8.0)

## 2021-11-01 LAB — BASIC METABOLIC PANEL
BUN: 13 (ref 4–21)
CO2: 31 — AB (ref 13–22)
Chloride: 102 (ref 99–108)
Creatinine: 0.6 (ref 0.5–1.1)
Glucose: 144
Potassium: 4 mEq/L (ref 3.5–5.1)
Sodium: 138 (ref 137–147)

## 2021-11-01 LAB — CBC AND DIFFERENTIAL
HCT: 39 (ref 36–46)
Hemoglobin: 13.4 (ref 12.0–16.0)
Platelets: 259 10*3/uL (ref 150–400)
WBC: 4.9

## 2021-11-01 LAB — COMPREHENSIVE METABOLIC PANEL
Albumin: 10.1 — AB (ref 3.5–5.0)
Albumin: 4.6 (ref 3.5–5.0)
Calcium: 10.1 (ref 8.7–10.7)

## 2021-11-01 LAB — CBC: RBC: 4.37 (ref 3.87–5.11)

## 2021-11-01 LAB — HEPATIC FUNCTION PANEL
ALT: 27 U/L (ref 7–35)
AST: 33 (ref 13–35)
Alkaline Phosphatase: 66 (ref 25–125)

## 2021-11-01 NOTE — Progress Notes (Signed)
Acute Office Visit  Subjective:    Patient ID: Maria Allison, female    DOB: 1950-09-11, 71 y.o.   MRN: [id number]  Chief Complaint  Patient presents with   Fatigue    HPI: Patient is in today for complaints of feeling weak and fatigue for the past several weeks.  She states it started after having mild uri symptoms about a month ago.  Her congestion symptoms resolved however she has continued to feel weak.  She denies fever, weight changes, no abdominal pain or urine symptoms.  No changes in bowels.  She does state that she had episode of chest pain yesterday while sitting in recliner that lasted a few minutes.  She states she has had intermittent chest pains in past but dismissed as possibly being gastritis type symptoms.  Pt does have history of hypertension and hyperlipidemia Pt also mentions in the past several weeks she has noted exertional dyspnea with minimal activity  Past Medical History:  Diagnosis Date   Chest pain    Depression    GERD (gastroesophageal reflux disease)    Hyperlipemia    Osteoporosis     Past Surgical History:  Procedure Laterality Date   ABDOMINAL EXPLORATION SURGERY     BREAST BIOPSY  1997   CARDIAC CATHETERIZATION     LUMBAR [place]      Family History  Problem Relation Age of Onset   Thrombosis Other        There is a family history of portal vein thrombosis for which family screening has been recommended at [facility]   Leukemia Mother    Heart disease Father    Kidney cancer Father    Heart disease Maternal Grandmother    Ulcerative colitis Maternal Grandfather    Lung cancer Paternal Grandmother    Liver cancer Paternal Grandmother    Heart disease Brother    Heart attack Brother     Social History   Socioeconomic History   Marital status: Married    Spouse name: Not on file   Number of children: 1   Years of education: Not on file   Highest education level: Some college, no degree  Occupational History    Occupation: Retired  Tobacco Use   Smoking status: Never   Smokeless tobacco: Never  Vaping Use   Vaping Use: Never used  Substance and Sexual Activity   Alcohol use: Yes    Alcohol/week: 1.0 standard drink of alcohol    Types: 1 Glasses of wine per week    Comment: five times per week   Drug use: No   Sexual activity: Yes    Partners: Male    Birth control/protection: None  Other Topics Concern   Not on file  Social History Narrative   The patient lives in [place] with her husband.    She has one daughter who is in college at [employer].    She admits to just occasional alcohol use.  She denies any tobacco    abuse.         Social Determinants of Health   Financial Resource Strain: Not on file  Food Insecurity: Not on file  Transportation Needs: No Transportation Needs (03/02/2020)   PRAPARE - Hydrologist (Medical): No    Lack of Transportation (Non-Medical): No  Physical Activity: Sufficiently Active (03/02/2020)   Exercise Vital Sign    Days of Exercise per Week: 5 days  Minutes of Exercise per Session: 150+ min  Stress: Not on file  Social Connections: Not on file  Intimate Partner Violence: Not At Risk (03/02/2020)   Humiliation, Afraid, Rape, and Kick questionnaire    Fear of Current or Ex-Partner: No    Emotionally Abused: No    Physically Abused: No    Sexually Abused: No    Outpatient Medications Prior to Visit  Medication Sig Dispense Refill   amLODipine (NORVASC) 2.5 MG tablet TAKE 1 TABLET(2.5 MG) BY MOUTH DAILY 90 tablet 1   aspirin 81 MG tablet Take 81 mg by mouth daily.     Calcium Carbonate-Vitamin D (CALCIUM-VITAMIN D3 PO) Take by mouth daily.     Coenzyme Q10 (COQ-10 PO) Take 1 tablet by mouth daily.     EPINEPHrine 0.3 mg/0.3 mL IJ SOAJ injection Use as directed for life-threatening allergic reaction. 1 each 3   Fish Oil OIL Take 1 tablet by mouth daily.     meloxicam (MOBIC) 15 MG tablet  Take 1 tablet (15 mg total) by mouth daily. 90 tablet 3   montelukast (SINGULAIR) 10 MG tablet TAKE 1 TABLET(10 MG) BY MOUTH AT BEDTIME 90 tablet 3   omeprazole (PRILOSEC) 40 MG capsule TAKE 1 CAPSULE(40 MG) BY MOUTH DAILY 90 capsule 3   Polyethyl Glycol-Propyl Glycol (SYSTANE) 0.4-0.3 % SOLN Apply to eye.     rosuvastatin (CRESTOR) 20 MG tablet TAKE 1 TABLET(20 MG) BY MOUTH EVERY DAY 90 tablet 3   sertraline (ZOLOFT) 50 MG tablet TAKE 1 TABLET(50 MG) BY MOUTH DAILY 90 tablet 3   No facility-administered medications prior to visit.    Allergies  Allergen Reactions   Other Swelling    Fresh tree fruit    Review of Systems CONSTITUTIONAL: see HPI E/N/T: Negative for ear pain, nasal congestion and sore throat.  CARDIOVASCULAR: see HPI RESPIRATORY: see HPI GASTROINTESTINAL: see HPI MSK: Negative for arthralgias and myalgias.  INTEGUMENTARY: Negative for rash.  NEUROLOGICAL: Negative for dizziness and headaches.          Objective:  PHYSICAL EXAM:   VS: BP 122/80 (BP Location: Left Arm, Patient Position: Sitting, Cuff Size: Small)   Pulse 68   Temp (!) 97 F (36.1 C) (Temporal)   Ht 5' 1"  (1.549 m)   Wt 121 lb (54.9 kg)   SpO2 95%   BMI 22.86 kg/m   GEN: Well nourished, well developed, in no acute distress   Neck: no JVD or masses - no thyromegaly Cardiac: RRR; no murmurs, rubs, or gallops,no edema -  Respiratory:  normal respiratory rate and pattern with no distress - normal breath sounds with no rales, rhonchi, wheezes or rubs GI: normal bowel sounds, no masses or tenderness  Skin: warm and dry, no rash   Psych: euthymic mood, appropriate affect and demeanor  EKG - sinus bradycardia noted  Health Maintenance Due  Topic Date Due   Hepatitis C Screening  Never done   Zoster Vaccines- Shingrix (1 of 2) Never done   COVID-19 Vaccine (4 - Pfizer series) 02/23/2020   DEXA SCAN  04/29/2021   INFLUENZA VACCINE  10/10/2021    There are no preventive care reminders  to display for this patient.   Lab Results  Component Value Date   TSH 3.080 03/07/2021   Lab Results  Component Value Date   WBC 5.8 03/07/2021   HGB 13.8 03/07/2021   HCT 39.7 03/07/2021   MCV 87 03/07/2021   PLT 227 03/07/2021   Lab Results  Component Value Date   NA 141 03/07/2021   K 5.0 03/07/2021   CO2 25 03/07/2021   GLUCOSE 93 03/07/2021   BUN 13 03/07/2021   CREATININE 0.75 03/07/2021   BILITOT 0.4 03/07/2021   ALKPHOS 71 03/07/2021   AST 27 03/07/2021   ALT 23 03/07/2021   PROT 7.1 03/07/2021   ALBUMIN 4.8 03/07/2021   CALCIUM 9.7 03/07/2021   EGFR 86 03/07/2021   Lab Results  Component Value Date   CHOL 220 (H) 03/07/2021   Lab Results  Component Value Date   HDL 91 03/07/2021   Lab Results  Component Value Date   LDLCALC 112 (H) 03/07/2021   Lab Results  Component Value Date   TRIG 96 03/07/2021   Lab Results  Component Value Date   CHOLHDL 2.4 03/07/2021   No results found for: "HGBA1C"     Assessment & Plan:   Problem List Items Addressed This Visit   None Visit Diagnoses     Other chest pain    -  Primary   Relevant Orders   EKG 12-Lead (Completed)   CBC with Differential/Platelet   Comprehensive metabolic panel   TSH   Troponin T   DG Chest 2 View   Malaise       Relevant Orders   POCT URINALYSIS DIP (CLINITEK) (Completed)   CBC with Differential/Platelet   Comprehensive metabolic panel   TSH   Troponin T   DG Chest 2 View   Exertional dyspnea       Relevant Orders   DG Chest 2 View      No orders of the defined types were placed in this encounter.   Orders Placed This Encounter  Procedures   DG Chest 2 View   CBC with Differential/Platelet   Comprehensive metabolic panel   TSH   Troponin T   POCT URINALYSIS DIP (CLINITEK)   EKG 12-Lead     Follow-up: Return if symptoms worsen or fail to improve, for otherwise for chronic follow up with Dr Tobie Poet in 3 months. Will plan to refer to cardiology after labs  resulted  An After Visit Summary was printed and given to the patient.  Yetta Flock [facility] [phone]

## 2021-11-02 ENCOUNTER — Encounter: Payer: Self-pay | Admitting: Physician Assistant

## 2021-11-07 ENCOUNTER — Encounter: Payer: Self-pay | Admitting: Cardiology

## 2021-11-07 ENCOUNTER — Ambulatory Visit: Payer: PPO | Attending: Cardiology | Admitting: Cardiology

## 2021-11-07 VITALS — BP 110/60 | HR 59 | Ht 61.0 in | Wt 121.8 lb

## 2021-11-07 DIAGNOSIS — R079 Chest pain, unspecified: Secondary | ICD-10-CM

## 2021-11-07 DIAGNOSIS — R072 Precordial pain: Secondary | ICD-10-CM | POA: Diagnosis not present

## 2021-11-07 DIAGNOSIS — E7800 Pure hypercholesterolemia, unspecified: Secondary | ICD-10-CM | POA: Diagnosis not present

## 2021-11-07 DIAGNOSIS — R931 Abnormal findings on diagnostic imaging of heart and coronary circulation: Secondary | ICD-10-CM

## 2021-11-07 NOTE — Progress Notes (Signed)
Cardiology CONSULT Note    Date:  11/07/2021   ID:  Maria Allison, DOB January 30, 1951, MRN [id number]  PCP:  Rochel Brome, MD  Cardiologist:  Fransico Him, MD   Chief Complaint  Patient presents with   New Patient (Initial Visit)    Establish cardiac care for hyperlipidemia and coronary artery calcifications    History of Present Illness:  Maria Allison is a 71 y.o. female who is being seen today for the evaluation of hyperlipidemia and chest pain at the request of Marge Duncans, PA-C.  This is a 71 year old female with a history of GERD, hyperlipidemia and depression.  She was remotely seen by Dr. Johnsie Cancel back in 2018 for evaluation of hyperlipidemia.  She had a history of normal cath in 2010, coronary calcium score of 0 in 2013 and then repeat calcium score in 2017 which is increased to 53.  Remotely she had an LDL in the 180 range with particle size over 2000.  Her HDL has been good in the 80s and has been taking red yeast in the past because she was averse to long-term statin therapy.    She is now here to reestablish care.  She tells me that she was keeping her grandson last month and had a viral and bacterial infection.  She then got sick with laryngitis and head congestion but no fever.  Since then she has felt poorly with weakness, lack of energy and now having some chest pain.  She says that the pain in her chest is over her left breast that is sharp but sometimes is central in location and pressure with no radiation.  She does have hx of GERD and esophageal spasm and the pain she is having now is very similar.  She has some SOB with the pain but no diaphoresis or nausea.  She has also noticed some DOE when working outside.   She denies any PND, orthopnea, LE edema, dizziness, palpitations or syncope. She is compliant with her meds and is tolerating meds with no SE.    Past Medical History:  Diagnosis Date   Chest pain    Depression    GERD (gastroesophageal reflux disease)     Hyperlipemia    Osteoporosis     Past Surgical History:  Procedure Laterality Date   ABDOMINAL EXPLORATION SURGERY     BREAST BIOPSY  1997   CARDIAC CATHETERIZATION     LUMBAR DISC SURGERY     TONSILLECTOMY      Current Medications: Current Meds  Medication Sig   amLODipine (NORVASC) 2.5 MG tablet TAKE 1 TABLET(2.5 MG) BY MOUTH DAILY   aspirin 81 MG tablet Take 81 mg by mouth daily.   Calcium Carbonate-Vitamin D (CALCIUM-VITAMIN D3 PO) Take by mouth daily.   Coenzyme Q10 (COQ-10 PO) Take 1 tablet by mouth daily.   EPINEPHrine 0.3 mg/0.3 mL IJ SOAJ injection Use as directed for life-threatening allergic reaction.   Fish Oil OIL Take 1 tablet by mouth daily.   montelukast (SINGULAIR) 10 MG tablet TAKE 1 TABLET(10 MG) BY MOUTH AT BEDTIME   omeprazole (PRILOSEC) 40 MG capsule TAKE 1 CAPSULE(40 MG) BY MOUTH DAILY   Polyethyl Glycol-Propyl Glycol (SYSTANE) 0.4-0.3 % SOLN Apply to eye.   rosuvastatin (CRESTOR) 20 MG tablet TAKE 1 TABLET(20 MG) BY MOUTH EVERY DAY   sertraline (ZOLOFT) 50 MG tablet TAKE 1 TABLET(50 MG) BY MOUTH DAILY    Allergies:   Other   Social History   Socioeconomic History  Marital status: Married    Spouse name: Not on file   Number of children: 1   Years of education: Not on file   Highest education level: Some college, no degree  Occupational History   Occupation: Retired  Tobacco Use   Smoking status: Never   Smokeless tobacco: Never  Vaping Use   Vaping Use: Never used  Substance and Sexual Activity   Alcohol use: Yes    Alcohol/week: 1.0 standard drink of alcohol    Types: 1 Glasses of wine per week    Comment: five times per week   Drug use: No   Sexual activity: Yes    Partners: Male    Birth control/protection: None  Other Topics Concern   Not on file  Social History Narrative   The patient lives in [place] with her husband.    She has one daughter who is in college at [employer].    She admits to just occasional  alcohol use.  She denies any tobacco    abuse.         Social Determinants of Health   Financial Resource Strain: Not on file  Food Insecurity: Not on file  Transportation Needs: No Transportation Needs (03/02/2020)   PRAPARE - Hydrologist (Medical): No    Lack of Transportation (Non-Medical): No  Physical Activity: Sufficiently Active (03/02/2020)   Exercise Vital Sign    Days of Exercise per Week: 5 days    Minutes of Exercise per Session: 150+ min  Stress: Not on file  Social Connections: Not on file     Family History:  The patient's family history includes Heart attack in her brother; Heart disease in her brother, father, and maternal grandmother; Kidney cancer in her father; Leukemia in her mother; Liver cancer in her paternal grandmother; Lung cancer in her paternal grandmother; Thrombosis in an other family member; Ulcerative colitis in her maternal grandfather.   ROS:   Please see the history of present illness.    ROS All other systems reviewed and are negative.      No data to display             PHYSICAL EXAM:   VS:  BP 110/60   Pulse (!) 59   Ht '5\' 1"'$  (1.549 m)   Wt 121 lb 12.8 oz (55.2 kg)   SpO2 98%   BMI 23.01 kg/m    GEN: Well nourished, well developed, in no acute distress  HEENT: normal  Neck: no JVD, carotid bruits, or masses Cardiac: RRR; no murmurs, rubs, or gallops,no edema.  Intact distal pulses bilaterally.  Respiratory:  clear to auscultation bilaterally, normal work of breathing GI: soft, nontender, nondistended, + BS MS: no deformity or atrophy  Skin: warm and dry, no rash Neuro:  Alert and Oriented x 3, Strength and sensation are intact Psych: euthymic mood, full affect  Wt Readings from Last 3 Encounters:  11/07/21 121 lb 12.8 oz (55.2 kg)  11/01/21 121 lb (54.9 kg)  03/07/21 123 lb 9.6 oz (56.1 kg)      Studies/Labs Reviewed:   EKG:  EKG is ordered today.  The ekg ordered today demonstrates  NSR with low voltage QRS  Recent Labs: 03/07/2021: TSH 3.080 11/01/2021: ALT 27; BUN 13; Creatinine 0.6; Hemoglobin 13.4; Platelets 259; Potassium 4.0; Sodium 138   Lipid Panel    Component Value Date/Time   CHOL 220 (H) 03/07/2021 1101   TRIG 96 03/07/2021 1101  HDL 91 03/07/2021 1101   CHOLHDL 2.4 03/07/2021 1101   CHOLHDL 2.5 02/15/2016 0904   VLDL 12 02/15/2016 0904   LDLCALC 112 (H) 03/07/2021 1101     CHA2DS2-VASc Score =     This indicates a  % annual risk of stroke. The patient's score is based upon:             Additional studies/ records that were reviewed today include:  None    ASSESSMENT:    1. Pure hypercholesterolemia   2. Agatston coronary artery calcium score less than 100   3. Chest pain of uncertain etiology      PLAN:  In order of problems listed above:  Hyperlipidemia -LDL goal is less than 70 due to known coronary calcium -I have personally reviewed and interpreted outside labs performed by patient's PCP which showed LDL 112 and HDL 91 on 03/07/2021>> started on Crestor 20 mg daily at that time -Recommend repeating fasting lipid panel and ALT and if LDL is not below 70 then would recommend increasing Crestor to 40 mg daily if she can tolerate.  She cannot tolerate it then would refer to lipid clinic for consideration of PCSK9 inhibitor -check Lpa and LipoB  2.  Coronary artery calcium/Chest pain -Her last coronary calcium score in 2017 was 53 -I will reach peak this to see if it is increased further and if so would push to get LDL definitely below 70  -Continue statin therapy -She has been having some CP but similar to what she had with normal cath in 2010 when she was dx with esophageal spasm -will get a coronary CTA to define coronary anatomy  3.  DOE -she has this when working outside -she also has had weakness and fatigue since having a viral illness a month ago -check 2D echo to assess LVF  Time Spent: 25 minutes total time of  encounter, including 15 minutes spent in face-to-face patient care on the date of this encounter. This time includes coordination of care and counseling regarding above mentioned problem list. Remainder of non-face-to-face time involved reviewing chart documents/testing relevant to the patient encounter and documentation in the medical record. I have independently reviewed documentation from referring provider  Medication Adjustments/Labs and Tests Ordered: Current medicines are reviewed at length with the patient today.  Concerns regarding medicines are outlined above.  Medication changes, Labs and Tests ordered today are listed in the Patient Instructions below.  There are no Patient Instructions on file for this visit.   Signed, Fransico Him, MD  11/07/2021 4:14 PM    [facility] [place], [place], [place]  [address] Phone: [phone]; Fax: [phone]

## 2021-11-07 NOTE — Addendum Note (Signed)
Addended by: Antonieta Iba on: 11/07/2021 04:22 PM   Modules accepted: Orders

## 2021-11-07 NOTE — Addendum Note (Signed)
Addended by: Antonieta Iba on: 11/07/2021 04:30 PM   Modules accepted: Orders

## 2021-11-07 NOTE — Patient Instructions (Addendum)
Medication Instructions:  Your physician recommends that you continue on your current medications as directed. Please refer to the Current Medication list given to you today.  *If you need a refill on your cardiac medications before your next appointment, please call your pharmacy*  Lab Work: Come back fasting for a lipid panel, Lpa, and LipoB If you have labs (blood work) drawn today and your tests are completely normal, you will receive your results only by: [place] (if you have MyChart) OR A paper copy in the mail If you have any lab test that is abnormal or we need to change your treatment, we will call you to review the results.   Testing/Procedures: Your physician has requested that you have an echocardiogram. Echocardiography is a painless test that uses sound waves to create images of your heart. It provides your doctor with information about the size and shape of your heart and how well your heart's chambers and valves are working. This procedure takes approximately one hour. There are no restrictions for this procedure.  Your physician has requested that you have a coronary CTA scan. Please see below for further instructions.    Follow-Up: At [facility], you and your health needs are our priority.  As part of our continuing mission to provide you with exceptional heart care, we have created designated Provider Care Teams.  These Care Teams include your primary Cardiologist (physician) and Advanced Practice Providers (APPs -  Physician Assistants and Nurse Practitioners) who all work together to provide you with the care you need, when you need it.  Your next appointment:   1 year(s)  The format for your next appointment:   In Person  Provider:   Fransico Him, MD  Other Instructions   Your cardiac CT will be scheduled at:   [facility] [address] [place], [place] [address] [phone]  Please arrive at the [facility] and Children's  Entrance (Entrance C2) of [facility] 30 minutes prior to test start time. You can use the FREE valet parking offered at entrance C (encouraged to control the heart rate for the test)  Proceed to the [facility] Radiology Department (first floor) to check-in and test prep.  All radiology patients and guests should use entrance C2 at [facility], accessed from [facility], even though the hospital's physical address listed is [address]     Please follow these instructions carefully (unless otherwise directed):  On the Night Before the Test: Be sure to Drink plenty of water. Do not consume any caffeinated/decaffeinated beverages or chocolate 12 hours prior to your test. Do not take any antihistamines 12 hours prior to your test.  On the Day of the Test: Drink plenty of water until 1 hour prior to the test. Do not eat any food 4 hours prior to the test. You may take your regular medications prior to the test.  FEMALES- please wear underwire-free bra if available, avoid dresses & tight clothing  After the Test: Drink plenty of water. After receiving IV contrast, you may experience a mild flushed feeling. This is normal. On occasion, you may experience a mild rash up to 24 hours after the test. This is not dangerous. If this occurs, you can take Benadryl 25 mg and increase your fluid intake. If you experience trouble breathing, this can be serious. If it is severe call 911 IMMEDIATELY. If it is mild, please call our office.  We will call to schedule  your test 2-4 weeks out understanding that some insurance companies will need an authorization prior to the service being performed.   For non-scheduling related questions, please contact the cardiac imaging nurse navigator should you have any questions/concerns: Marchia Bond, Cardiac Imaging Nurse Navigator Gordy Clement, Cardiac Imaging Nurse Navigator [facility] [place] Direct  Office Dial: [phone]   For scheduling needs, including cancellations and rescheduling, please call [place], [phone].   Important Information About Sugar

## 2021-11-10 ENCOUNTER — Ambulatory Visit: Payer: PPO | Admitting: Cardiology

## 2021-11-14 ENCOUNTER — Ambulatory Visit (HOSPITAL_COMMUNITY): Payer: PPO | Attending: Cardiology

## 2021-11-14 DIAGNOSIS — R079 Chest pain, unspecified: Secondary | ICD-10-CM

## 2021-11-14 DIAGNOSIS — R931 Abnormal findings on diagnostic imaging of heart and coronary circulation: Secondary | ICD-10-CM

## 2021-11-14 DIAGNOSIS — E7800 Pure hypercholesterolemia, unspecified: Secondary | ICD-10-CM | POA: Diagnosis not present

## 2021-11-14 LAB — ECHOCARDIOGRAM COMPLETE
Area-P 1/2: 2.87 cm2
S' Lateral: 2.9 cm

## 2021-11-15 ENCOUNTER — Telehealth: Payer: Self-pay

## 2021-11-15 DIAGNOSIS — R0602 Shortness of breath: Secondary | ICD-10-CM

## 2021-11-15 NOTE — Telephone Encounter (Signed)
-----   Message from Sueanne Margarita, MD sent at 11/15/2021  8:05 AM EDT ----- Please refer to pulmonary for shortness of breath

## 2021-11-15 NOTE — Telephone Encounter (Signed)
The patient has been notified of the result and verbalized understanding.  All questions (if any) were answered. Antonieta Iba, RN 11/15/2021 12:06 PM

## 2021-11-22 ENCOUNTER — Other Ambulatory Visit (HOSPITAL_COMMUNITY): Payer: PPO

## 2021-11-22 ENCOUNTER — Ambulatory Visit: Payer: PPO | Attending: Cardiology

## 2021-11-22 DIAGNOSIS — E7800 Pure hypercholesterolemia, unspecified: Secondary | ICD-10-CM

## 2021-11-22 DIAGNOSIS — R072 Precordial pain: Secondary | ICD-10-CM

## 2021-11-22 DIAGNOSIS — R931 Abnormal findings on diagnostic imaging of heart and coronary circulation: Secondary | ICD-10-CM | POA: Diagnosis not present

## 2021-11-22 DIAGNOSIS — R079 Chest pain, unspecified: Secondary | ICD-10-CM | POA: Diagnosis not present

## 2021-11-23 ENCOUNTER — Telehealth: Payer: Self-pay

## 2021-11-23 DIAGNOSIS — R931 Abnormal findings on diagnostic imaging of heart and coronary circulation: Secondary | ICD-10-CM

## 2021-11-23 DIAGNOSIS — E7800 Pure hypercholesterolemia, unspecified: Secondary | ICD-10-CM

## 2021-11-23 LAB — BASIC METABOLIC PANEL
BUN/Creatinine Ratio: 13 (ref 12–28)
BUN: 10 mg/dL (ref 8–27)
CO2: 26 mmol/L (ref 20–29)
Calcium: 9.7 mg/dL (ref 8.7–10.3)
Chloride: 103 mmol/L (ref 96–106)
Creatinine, Ser: 0.78 mg/dL (ref 0.57–1.00)
Glucose: 103 mg/dL — ABNORMAL HIGH (ref 70–99)
Potassium: 4.8 mmol/L (ref 3.5–5.2)
Sodium: 141 mmol/L (ref 134–144)
eGFR: 82 mL/min/{1.73_m2} (ref >59)

## 2021-11-23 LAB — LIPOPROTEIN A (LPA): Lipoprotein (a): 167.6 nmol/L — ABNORMAL HIGH (ref <75.0)

## 2021-11-23 LAB — LIPID PANEL
Chol/HDL Ratio: 2.4 ratio (ref 0.0–4.4)
Cholesterol, Total: 167 mg/dL (ref 100–199)
HDL: 70 mg/dL (ref >39)
LDL Chol Calc (NIH): 86 mg/dL (ref 0–99)
Triglycerides: 52 mg/dL (ref 0–149)
VLDL Cholesterol Cal: 11 mg/dL (ref 5–40)

## 2021-11-23 LAB — APOLIPOPROTEIN B: Apolipoprotein B: 75 mg/dL (ref <90)

## 2021-11-23 MED ORDER — ROSUVASTATIN CALCIUM 40 MG PO TABS: 40.0000 mg | ORAL_TABLET | Freq: Every day | ORAL | 3 refills | Status: DC

## 2021-11-23 NOTE — Telephone Encounter (Signed)
-----   Message from Sueanne Margarita, MD sent at 11/22/2021  5:49 PM EDT ----- Crease atorvastatin to 40 mg daily repeat FLP and ALT in 6 weeks

## 2021-11-23 NOTE — Telephone Encounter (Signed)
The patient has been notified of the result and verbalized understanding.  All questions (if any) were answered. Antonieta Iba, RN 11/23/2021 8:32 AM  Patient will increase rosuvastatin to 40 mg daily. Repeat labs have been scheduled.

## 2021-11-24 ENCOUNTER — Telehealth (HOSPITAL_COMMUNITY): Payer: Self-pay | Admitting: *Deleted

## 2021-11-24 NOTE — Telephone Encounter (Signed)
Reaching out to patient to offer assistance regarding upcoming cardiac imaging study; pt verbalizes understanding of appt date/time, parking situation and where to check in, and verified current allergies; name and call back number provided for further questions should they arise  Maria Clement RN Navigator Cardiac [place] [facility] [phone] office [phone] cell  Patient to arrive at 10:30am.

## 2021-11-27 ENCOUNTER — Encounter: Payer: Self-pay | Admitting: Cardiology

## 2021-11-27 ENCOUNTER — Ambulatory Visit (HOSPITAL_COMMUNITY)
Admission: RE | Admit: 2021-11-27 | Discharge: 2021-11-27 | Disposition: A | Discharge status: Home / Self Care | Payer: PPO | Source: Ambulatory Visit | Attending: Cardiology | Admitting: Cardiology

## 2021-11-27 ENCOUNTER — Encounter (HOSPITAL_COMMUNITY): Payer: Self-pay

## 2021-11-27 DIAGNOSIS — I2510 Atherosclerotic heart disease of native coronary artery without angina pectoris: Secondary | ICD-10-CM | POA: Insufficient documentation

## 2021-11-27 DIAGNOSIS — R072 Precordial pain: Secondary | ICD-10-CM | POA: Diagnosis not present

## 2021-11-27 HISTORY — DX: Atherosclerotic heart disease of native coronary artery without angina pectoris: I25.10

## 2021-11-27 MED ORDER — NITROGLYCERIN 0.4 MG SL SUBL
0.8000 mg | SUBLINGUAL_TABLET | Freq: Once | SUBLINGUAL | Status: AC
  Administered 2021-11-27: 0.8 mg via SUBLINGUAL

## 2021-11-27 MED ORDER — IOHEXOL 350 MG/ML SOLN
100.0000 mL | Freq: Once | INTRAVENOUS | Status: AC | PRN
  Administered 2021-11-27: 100 mL via INTRAVENOUS

## 2021-11-27 MED ORDER — NITROGLYCERIN 0.4 MG SL SUBL
SUBLINGUAL_TABLET | SUBLINGUAL | Status: AC
  Filled 2021-11-27: qty 2

## 2021-12-11 ENCOUNTER — Institutional Professional Consult (permissible substitution): Payer: PPO | Admitting: Pulmonary Disease

## 2022-01-23 ENCOUNTER — Ambulatory Visit: Payer: PPO | Attending: Cardiology

## 2022-01-23 DIAGNOSIS — R931 Abnormal findings on diagnostic imaging of heart and coronary circulation: Secondary | ICD-10-CM | POA: Diagnosis not present

## 2022-01-23 DIAGNOSIS — E7800 Pure hypercholesterolemia, unspecified: Secondary | ICD-10-CM | POA: Diagnosis not present

## 2022-01-24 ENCOUNTER — Telehealth: Payer: Self-pay

## 2022-01-24 DIAGNOSIS — E785 Hyperlipidemia, unspecified: Secondary | ICD-10-CM

## 2022-01-24 LAB — LIPID PANEL
Chol/HDL Ratio: 2.7 ratio (ref 0.0–4.4)
Cholesterol, Total: 206 mg/dL — ABNORMAL HIGH (ref 100–199)
HDL: 76 mg/dL (ref >39)
LDL Chol Calc (NIH): 118 mg/dL — ABNORMAL HIGH (ref 0–99)
Triglycerides: 66 mg/dL (ref 0–149)
VLDL Cholesterol Cal: 12 mg/dL (ref 5–40)

## 2022-01-24 LAB — ALT: ALT: 21 IU/L (ref 0–32)

## 2022-01-24 LAB — LIPOPROTEIN A (LPA): Lipoprotein (a): 195.9 nmol/L — ABNORMAL HIGH (ref <75.0)

## 2022-01-24 NOTE — Telephone Encounter (Signed)
Spoke with pt and advised per Dr Radford Pax Lipids are still not at goal and a referral to Lipid clinic is recommended.  Pt verbalizes understanding and agrees with current plan.  Order placed and message sent to [facility] pool to assist with scheduling.

## 2022-01-24 NOTE — Telephone Encounter (Signed)
-----   Message from Sueanne Margarita, MD sent at 01/24/2022  7:19 AM EST ----- Lipids still not at goal.  Please forward to lipid clinic for further recommendations.

## 2022-02-27 ENCOUNTER — Other Ambulatory Visit: Payer: Self-pay | Admitting: Family Medicine

## 2022-03-08 ENCOUNTER — Ambulatory Visit (INDEPENDENT_AMBULATORY_CARE_PROVIDER_SITE_OTHER): Payer: PPO

## 2022-03-08 VITALS — BP 128/80 | Resp 16 | Ht 61.0 in | Wt 124.4 lb

## 2022-03-08 DIAGNOSIS — Z23 Encounter for immunization: Secondary | ICD-10-CM | POA: Diagnosis not present

## 2022-03-08 DIAGNOSIS — Z0000 Encounter for general adult medical examination without abnormal findings: Secondary | ICD-10-CM

## 2022-03-12 NOTE — Patient Instructions (Signed)
Health Maintenance After Age 72 After age 72, you are at a higher risk for certain long-term diseases and infections as well as injuries from falls. Falls are a major cause of broken bones and head injuries in people who are older than age 72. Getting regular preventive care can help to keep you healthy and well. Preventive care includes getting regular testing and making lifestyle changes as recommended by your health care provider. Talk with your health care provider about: Which screenings and tests you should have. A screening is a test that checks for a disease when you have no symptoms. A diet and exercise plan that is right for you. What should I know about screenings and tests to prevent falls? Screening and testing are the best ways to find a health problem early. Early diagnosis and treatment give you the best chance of managing medical conditions that are common after age 72. Certain conditions and lifestyle choices may make you more likely to have a fall. Your health care provider may recommend: Regular vision checks. Poor vision and conditions such as cataracts can make you more likely to have a fall. If you wear glasses, make sure to get your prescription updated if your vision changes. Medicine review. Work with your health care provider to regularly review all of the medicines you are taking, including over-the-counter medicines. Ask your health care provider about any side effects that may make you more likely to have a fall. Tell your health care provider if any medicines that you take make you feel dizzy or sleepy. Strength and balance checks. Your health care provider may recommend certain tests to check your strength and balance while standing, walking, or changing positions. Foot health exam. Foot pain and numbness, as well as not wearing proper footwear, can make you more likely to have a fall. Screenings, including: Osteoporosis screening. Osteoporosis is a condition that causes  the bones to get weaker and break more easily. Blood pressure screening. Blood pressure changes and medicines to control blood pressure can make you feel dizzy. Depression screening. You may be more likely to have a fall if you have a fear of falling, feel depressed, or feel unable to do activities that you used to do. Alcohol use screening. Using too much alcohol can affect your balance and may make you more likely to have a fall. Follow these instructions at home: Lifestyle Do not drink alcohol if: Your health care provider tells you not to drink. If you drink alcohol: Limit how much you have to: 0-1 drink a day for women. 0-2 drinks a day for men. Know how much alcohol is in your drink. In the [place] one drink equals one 12 oz bottle of beer (355 mL), one 5 oz glass of wine (148 mL), or one 1 oz glass of hard liquor (44 mL). Do not use any products that contain nicotine or tobacco. These products include cigarettes, chewing tobacco, and vaping devices, such as e-cigarettes. If you need help quitting, ask your health care provider. Activity  Follow a regular exercise program to stay fit. This will help you maintain your balance. Ask your health care provider what types of exercise are appropriate for you. If you need a cane or walker, use it as recommended by your health care provider. Wear supportive shoes that have nonskid soles. Safety  Remove any tripping hazards, such as rugs, cords, and clutter. Install safety equipment such as grab bars in bathrooms and safety rails on stairs. Keep rooms and walkways   well-lit. General instructions Talk with your health care provider about your risks for falling. Tell your health care provider if: You fall. Be sure to tell your health care provider about all falls, even ones that seem minor. You feel dizzy, tiredness (fatigue), or off-balance. Take over-the-counter and prescription medicines only as told by your health care provider. These include  supplements. Eat a healthy diet and maintain a healthy weight. A healthy diet includes low-fat dairy products, low-fat (lean) meats, and fiber from whole grains, beans, and lots of fruits and vegetables. Stay current with your vaccines. Schedule regular health, dental, and eye exams. Summary Having a healthy lifestyle and getting preventive care can help to protect your health and wellness after age 72. Screening and testing are the best way to find a health problem early and help you avoid having a fall. Early diagnosis and treatment give you the best chance for managing medical conditions that are more common for people who are older than age 72. Falls are a major cause of broken bones and head injuries in people who are older than age 72. Take precautions to prevent a fall at home. Work with your health care provider to learn what changes you can make to improve your health and wellness and to prevent falls. This information is not intended to replace advice given to you by your health care provider. Make sure you discuss any questions you have with your health care provider. Document Revised: 07/18/2020 Document Reviewed: 07/18/2020 Elsevier Patient Education  2023 Elsevier Inc.  

## 2022-03-12 NOTE — Progress Notes (Addendum)
I attest that I have reviewed this visit and agree with the plan established.  DR. Tobie Poet   Subjective:   Maria Allison is a 72 y.o. female who presents for Medicare Annual (Subsequent) preventive examination.  This wellness visit is conducted by a nurse.  The patient's medications were reviewed and reconciled since the patient's last visit.  History details were provided by the patient.  The history appears to be reliable.    Patient's last AWV was one year ago.   Medical History: Patient history and Family history was reviewed  Medications, Allergies, and preventative health maintenance was reviewed and updated.  Cardiac Risk Factors include: advanced age (>62mn, >>51women)     Objective:    Today's Vitals   03/08/22 1416  BP: 128/80  Resp: 16  Weight: 124 lb 6.4 oz (56.4 kg)  Height: '5\' 1"'$  (1.549 m)  PainSc: 0-No pain   Body mass index is 23.51 kg/m.     03/02/2020   10:21 AM  Advanced Directives  Does Patient Have a Medical Advance Directive? Yes  Type of Advance Directive Living will;Healthcare Power of [place] Chart? No - copy requested    Current Medications (verified) Outpatient Encounter Medications as of 03/08/2022  Medication Sig   aspirin 81 MG tablet Take 81 mg by mouth daily.   Calcium Carbonate-Vitamin D (CALCIUM-VITAMIN D3 PO) Take by mouth daily.   Coenzyme Q10 (COQ-10 PO) Take 1 tablet by mouth daily.   EPINEPHrine 0.3 mg/0.3 mL IJ SOAJ injection Use as directed for life-threatening allergic reaction.   Fish Oil OIL Take 1 tablet by mouth daily.   montelukast (SINGULAIR) 10 MG tablet TAKE 1 TABLET(10 MG) BY MOUTH AT BEDTIME   omeprazole (PRILOSEC) 40 MG capsule TAKE 1 CAPSULE(40 MG) BY MOUTH DAILY (Patient taking differently: Take 40 mg by mouth as needed. TAKE 1 CAPSULE(40 MG) BY MOUTH DAILY)   rosuvastatin (CRESTOR) 40 MG tablet Take 1 tablet (40 mg total) by mouth daily.   sertraline (ZOLOFT) 50 MG  tablet TAKE 1 TABLET(50 MG) BY MOUTH DAILY   amLODipine (NORVASC) 2.5 MG tablet TAKE 1 TABLET(2.5 MG) BY MOUTH DAILY   [DISCONTINUED] Polyethyl Glycol-Propyl Glycol (SYSTANE) 0.4-0.3 % SOLN Apply to eye.   No facility-administered encounter medications on file as of 03/08/2022.    Allergies (verified) Other   History: Past Medical History:  Diagnosis Date   CAD (coronary artery disease), native coronary artery    Coronary CTA shows minimal CAD of pLAD <25% stenosis with Ca score 91 on 9/23   Chest pain    Depression    GERD (gastroesophageal reflux disease)    Hyperlipemia    Osteoporosis    Past Surgical History:  Procedure Laterality Date   ABDOMINAL EXPLORATION SURGERY     BREAST BIOPSY  1997   CARDIAC CATHETERIZATION     LUMBAR DISC SURGERY     TONSILLECTOMY     Family History  Problem Relation Age of Onset   Thrombosis Other        There is a family history of portal vein thrombosis for which family screening [facility] been [facility]  Leukemia Mother    Heart disease Father    Kidney cancer Father    Heart disease Maternal Grandmother    Ulcerative colitis Maternal Grandfather    Lung cancer Paternal Grandmother    Liver cancer Paternal Grandmother    Heart disease Brother  Heart attack Brother    Social History   Socioeconomic History   Marital status: Married    Spouse name: Not on file   Number of children: 1   Years of education: Not on file   Highest education level: Some college, no degree  Occupational History   Occupation: Retired  Tobacco Use   Smoking status: Never   Smokeless tobacco: Never  Vaping Use   Vaping Use: Never used  Substance and Sexual Activity   Alcohol use: Yes    Alcohol/week: 1.0 standard drink of alcohol    Types: 1 Glasses of wine per week    Comment: five times per week   Drug use: No   Sexual activity: Yes    Partners: Male    Birth control/protection: None  Other Topics Concern   Not on file  Social  History Narrative   The patient lives in [place] with her husband.    She has one daughter who is in college at [employer].    She admits to just occasional alcohol use.  She denies any tobacco    abuse.         Social Determinants of Health   Financial Resource Strain: Not on file  Food Insecurity: Not on file  Transportation Needs: No Transportation Needs (03/02/2020)   PRAPARE - Hydrologist (Medical): No    Lack of Transportation (Non-Medical): No  Physical Activity: Sufficiently Active (03/02/2020)   Exercise Vital Sign    Days of Exercise per Week: 5 days    Minutes of Exercise per Session: 150+ min  Stress: Not on file  Social Connections: Not on file    Tobacco Counseling Counseling given: Not Answered   Clinical Intake:  Pre-visit preparation completed: Yes  Pain : No/denies pain Pain Score: 0-No pain     Nutritional Status: BMI of 19-24  Normal Nutritional Risks: None Diabetes: No  How often do you need to have someone help you when you read instructions, pamphlets, or other written materials from your doctor or pharmacy?: 1 - Never Interpreter Needed?: No    Activities of Daily Living    03/12/2022    3:05 AM  In your present state of health, do you have any difficulty performing the following activities:  Hearing? 0  Vision? 0  Difficulty concentrating or making decisions? 0  Walking or climbing stairs? 0  Dressing or bathing? 0  Doing errands, shopping? 0  Preparing Food and eating ? N  Using the Toilet? N  In the past six months, have you accidently leaked urine? N  Do you have problems with loss of bowel control? N  Managing your Medications? N  Managing your Finances? N  Housekeeping or managing your Housekeeping? N    Patient Care Team: Rochel Brome, MD as PCP - General (Family Medicine) Sueanne Margarita, MD as PCP - Cardiology (Cardiology) Burnice Logan, [facility] (Inactive) as Pharmacist  (Pharmacist)  Indicate any recent Medical Services you may have received from other than [facility] providers in the past year (date may be approximate).     Assessment:   This is a routine wellness examination for [place].  Hearing/Vision screen No results found.  Dietary issues and exercise activities discussed: Current Exercise Habits: The patient does not participate in regular exercise at present (Patient is active outside of home), Exercise limited by: None identified   Goals Addressed   None    Depression Screen    03/12/2022  3:05 AM 03/12/2021   12:12 AM 03/02/2020   12:27 PM 03/02/2020   10:20 AM  PHQ 2/9 Scores  PHQ - 2 Score 0 0 0 0    Fall Risk    03/12/2022    3:04 AM 03/09/2020   11:31 PM 03/02/2020   10:22 AM  Fall Risk   Falls in the past year? 1  0  Number falls in past yr: 0  0  Injury with Fall? 0  0  Risk for fall due to : No Fall Risks No Fall Risks Other (Comment)  Follow up Falls evaluation completed;Falls prevention discussed Falls evaluation completed;Falls prevention discussed     FALL RISK PREVENTION PERTAINING TO THE HOME:  Any stairs in or around the home? Yes  If so, are there any without handrails? No  Home free of loose throw rugs in walkways, pet beds, electrical cords, etc? Yes  Adequate lighting in your home to reduce risk of falls? Yes   ASSISTIVE DEVICES UTILIZED TO PREVENT FALLS:  Life alert? No  Use of a cane, walker or w/c? No  Grab bars in the bathroom? No  Shower chair or bench in shower? No  Elevated toilet seat or a handicapped toilet? No   Gait steady and fast without use of assistive device  Cognitive Function:        03/12/2022    3:06 AM 03/02/2020   10:25 AM  6CIT Screen  What Year? 0 points 0 points  What month? 0 points 0 points  What time? 0 points 0 points  Count back from 20 0 points 0 points  Months in reverse 0 points 0 points  Repeat phrase 0 points 0 points  Total Score 0 points 0 points     Immunizations Immunization History  Administered Date(s) Administered   Fluad Quad(high Dose 65+) 02/09/2021, 03/08/2022   Hepatitis A 05/28/2017   Hepatitis B 05/28/2017   Influenza-Unspecified 01/12/2020   PFIZER(Purple Top)SARS-COV-2 Vaccination 05/14/2019, 06/09/2019, 12/29/2019   Pneumococcal Conjugate-13 01/03/2017   Pneumococcal Polysaccharide-23 02/13/2018   Tdap 05/28/2017   Zoster Recombinat (Shingrix) 05/21/2020, 10/17/2020    TDAP status: Up to date  Flu Vaccine status: Completed at today's visit  Pneumococcal vaccine status: Up to date  Covid-19 vaccine status: Information provided on how to obtain vaccines.   Qualifies for Shingles Vaccine? Yes   Zostavax completed No   Shingrix Completed?: Yes  Screening Tests Health Maintenance  Topic Date Due   Hepatitis C Screening  Never done   DEXA SCAN  04/29/2021   COVID-19 Vaccine (4 - 2023-24 season) 11/10/2021   MAMMOGRAM  05/31/2022   Medicare Annual Wellness ([place])  03/09/2023   COLONOSCOPY (Pts 45-22yr Insurance coverage will need to be confirmed)  08/24/2024   DTaP/Tdap/Td (2 - Td or Tdap) 05/29/2027   Pneumonia Vaccine 72 Years old  Completed   INFLUENZA VACCINE  Completed   Zoster Vaccines- Shingrix  Completed   HPV VACCINES  Aged Out    Health Maintenance  Health Maintenance Due  Topic Date Due   Hepatitis C Screening  Never done   DEXA SCAN  04/29/2021   COVID-19 Vaccine (4 - 2023-24 season) 11/10/2021    Colorectal cancer screening: Type of screening: Colonoscopy. Completed 2016. Repeat every 10 years  Mammogram status: Completed 05/2021. Repeat every year  Bone Density status: Completed 04/2019. Results reflect: Bone density results: OSTEOPENIA. Repeat every 2 years.  Lung Cancer Screening: (Low Dose CT Chest recommended if Age 72-80years, 30 pack-year  currently smoking OR have quit w/in 15years.) does not qualify.   Lung Cancer Screening Referral: N/A  Additional  Screening:  Vision Screening: Recommended annual ophthalmology exams for early detection of glaucoma and other disorders of the eye. Is the patient up to date with their annual eye exam?  Yes  Who is the provider or what is the name of the office in which the patient attends annual eye exams? [place] Screening: Recommended annual dental exams for proper oral hygiene  Community Resource Referral / Chronic Care Management: CRR required this visit?  No   CCM required this visit?  No      Plan:     I have personally reviewed and noted the following in the patient's chart:   Medical and social history Use of alcohol, tobacco or illicit drugs  Current medications and supplements including opioid prescriptions. Patient is not currently taking opioid prescriptions. Functional ability and status Nutritional status Physical activity Advanced directives List of other physicians Hospitalizations, surgeries, and ER visits in previous 12 months Vitals Screenings to include cognitive, depression, and falls Referrals and appointments  In addition, I have reviewed and discussed with patient certain preventive protocols, quality metrics, and best practice recommendations. A written personalized care plan for preventive services as well as general preventive health recommendations were provided to patient.     Rochel Brome, MD   04/07/2022

## 2022-03-19 ENCOUNTER — Ambulatory Visit: Payer: PPO | Attending: Cardiology | Admitting: Pharmacist

## 2022-03-19 DIAGNOSIS — R931 Abnormal findings on diagnostic imaging of heart and coronary circulation: Secondary | ICD-10-CM | POA: Insufficient documentation

## 2022-03-19 DIAGNOSIS — E7800 Pure hypercholesterolemia, unspecified: Secondary | ICD-10-CM

## 2022-03-19 DIAGNOSIS — E7841 Elevated Lipoprotein(a): Secondary | ICD-10-CM

## 2022-03-19 DIAGNOSIS — I2510 Atherosclerotic heart disease of native coronary artery without angina pectoris: Secondary | ICD-10-CM

## 2022-03-19 HISTORY — DX: Elevated lipoprotein(a): E78.41

## 2022-03-19 HISTORY — DX: Abnormal findings on diagnostic imaging of heart and coronary circulation: R93.1

## 2022-03-19 NOTE — Progress Notes (Signed)
Patient ID: LOUAN BASE                 DOB: October 25, 1950                    MRN: [id number]     HPI: Maria Allison is a 72 y.o. female patient referred to lipid clinic by Dr Radford Pax. PMH is significant for HLD, chest pain, GERD, and depression. Normal cath in 2010, coronary calcium score of 0 in 2013. Repeat calcium score in 2017 was 53. Coronary CTA 11/2021 showed calcified plaque in the prox LAD with 0-24% stenosis and calcium score of 91 (71st percentile for age and sex matched control). LDL in the 180 range with particle size over 2,000. Has been on rosuvastatin since 2017, dose increased over the years from '5mg'$  up to '40mg'$  most recently. LDL remained elevated at 118 on rosuvastatin '40mg'$  daily and pt was referred to lipid clinic for further management. Lp(a) also elevated.  Pt presents today with her husband. Reports tolerating rosuvastatin well. Also takes CoQ10. Has [employer] insurance, info below:  BIN [id number] PCN [id number] ID: [id number]  Current Medications: rosuvastatin '40mg'$  daily Intolerances: none Risk Factors: elevated calcium score, elevated Lp(a), FHx CAD LDL goal: '70mg'$ /dL  Diet: Mediterranean diet  Exercise: golf, yardwork, walking, water aerobics  Family History: Heart attack in her brother; Heart disease in her brother, father, and maternal grandmother; Kidney cancer in her father; Leukemia in her mother; Liver cancer in her paternal grandmother; Lung cancer in her paternal grandmother; Thrombosis in an other family member; Ulcerative colitis in her maternal grandfather.    Social History: No tobacco or drug use, occasional alcohol use.  Labs: 01/23/22: TC 206, TG 66, HDL 76, LDL 118, Lp(a) 195.9 (rosuvastatin '40mg'$  daily) 12/2015: LDL 211 Past Medical History:  Diagnosis Date   CAD (coronary artery disease), native coronary artery    Coronary CTA shows minimal CAD of pLAD <25% stenosis with Ca score 91 on 9/23   Chest pain    Depression     GERD (gastroesophageal reflux disease)    Hyperlipemia    Osteoporosis     Current Outpatient Medications on File Prior to Visit  Medication Sig Dispense Refill   amLODipine (NORVASC) 2.5 MG tablet TAKE 1 TABLET(2.5 MG) BY MOUTH DAILY (Patient not taking: Reported on 03/12/2022) 90 tablet 1   aspirin 81 MG tablet Take 81 mg by mouth daily.     Calcium Carbonate-Vitamin D (CALCIUM-VITAMIN D3 PO) Take by mouth daily.     Coenzyme Q10 (COQ-10 PO) Take 1 tablet by mouth daily.     EPINEPHrine 0.3 mg/0.3 mL IJ SOAJ injection Use as directed for life-threatening allergic reaction. 1 each 3   Fish Oil OIL Take 1 tablet by mouth daily.     montelukast (SINGULAIR) 10 MG tablet TAKE 1 TABLET(10 MG) BY MOUTH AT BEDTIME 90 tablet 3   omeprazole (PRILOSEC) 40 MG capsule TAKE 1 CAPSULE(40 MG) BY MOUTH DAILY 90 capsule 0   Polyethyl Glycol-Propyl Glycol (SYSTANE) 0.4-0.3 % SOLN Apply to eye.     rosuvastatin (CRESTOR) 40 MG tablet Take 1 tablet (40 mg total) by mouth daily. 90 tablet 3   sertraline (ZOLOFT) 50 MG tablet TAKE 1 TABLET(50 MG) BY MOUTH DAILY 90 tablet 3   No current facility-administered medications on file prior to visit.    Allergies  Allergen Reactions   Other Swelling    Fresh tree fruit    Assessment/Plan:  1. Hyperlipidemia - LDL 118 on rosuvastatin '40mg'$  daily, above goal < 70 given elevated coronary artery calcium score. Lp(a) elevated as well. Baseline LDL > 200. Discussed addition of either Nexlizet or PCSK9i with preference for PCSK9i since it will also lower her Lp(a) ~25%. Reviewed expected benefits, injection technique, and side effect profile of PCSK9i. Pt agreeable to try Repatha. Will submit prior authorization. She will continue on rosuvastatin. Scheduled f/u labs in 2-3 months to assess efficacy.  [name] E. Jnaya Butrick, PharmD, BCACP, [place]. [address] [place], [place] [address] Phone: [phone]; Fax: [phone] 03/19/2022 11:14 AM

## 2022-03-19 NOTE — Patient Instructions (Signed)
Your LDL cholesterol is 118 and your goal is < 70  Start Repatha injections once every 2 weeks in the fatty tissue of your stomach or upper outer thigh. Store the medication in the fridge. You can let your dose warm up to room temperature for 30 minutes before injecting if you prefer. Repatha will lower your LDL cholesterol by 60% and helps to lower your chance of having a heart attack or stroke.  Continue taking your other medications  Recheck fasting labs on Monday, March 4th any time after 7:30am

## 2022-03-21 ENCOUNTER — Telehealth: Payer: Self-pay | Admitting: Pharmacist

## 2022-03-21 MED ORDER — REPATHA SURECLICK 140 MG/ML ~~LOC~~ SOAJ: 1.0000 | SUBCUTANEOUS | 3 refills | Status: DC

## 2022-03-21 NOTE — Telephone Encounter (Signed)
Repatha PA approved through 09/18/22. Rx sent to pharmacy, f/u labs already scheduled.

## 2022-05-02 ENCOUNTER — Other Ambulatory Visit: Payer: Self-pay | Admitting: Family Medicine

## 2022-05-10 ENCOUNTER — Telehealth: Payer: Self-pay | Admitting: Cardiology

## 2022-05-10 NOTE — Telephone Encounter (Signed)
Called patient back regarding small lump that has formed near "cortical" artery. Patient clarifies that she was referring to her carotid artery. She states she has a small lump the size of an eraser in the middle of the right side of her neck. She states it is intermittently painful, particularly when she is active or moving around. She also states that she has had some tingling on the right side of her forehead. She has also noticed her BP has been elevated. Today her BP was 160/80, HR 59. She thinks her BP was 180/80 last week.  She states she noticed this lump on Sunday, 05/06/22 and then is when the intermittent pain and tingling started. Patient last had carotid scan 2013. Scheduled patient w/ Dr. Johney Frame for 05/15/22.

## 2022-05-10 NOTE — Telephone Encounter (Signed)
Pt called stating there is pain in her right cortical artery and there is a small lump that has formed. She also stated that she has had some tingling in her right temple that comes and goes, but she is not sure if that is related to the pain that she is feeling in her right cortical artery.

## 2022-05-11 ENCOUNTER — Ambulatory Visit: Payer: PPO | Attending: Cardiology | Admitting: Cardiovascular Disease

## 2022-05-11 ENCOUNTER — Encounter: Payer: Self-pay | Admitting: Cardiovascular Disease

## 2022-05-11 VITALS — BP 134/80 | HR 60 | Ht 61.0 in | Wt 126.2 lb

## 2022-05-11 DIAGNOSIS — R221 Localized swelling, mass and lump, neck: Secondary | ICD-10-CM | POA: Diagnosis not present

## 2022-05-11 NOTE — Patient Instructions (Signed)
Medication Instructions:  No changes *If you need a refill on your cardiac medications before your next appointment, please call your pharmacy*   Lab Work: none If you have labs (blood work) drawn today and your tests are completely normal, you will receive your results only by: [facility] (if you have MyChart) OR A paper copy in the mail If you have any lab test that is abnormal or we need to change your treatment, we will call you to review the results.   Testing/Procedures: none   Follow-Up: With Dr. Radford Pax as planned

## 2022-05-11 NOTE — Progress Notes (Signed)
Chief Complaint  Patient presents with   Follow-up    Lump in her neck   History of Present Illness: 72 yo female with history of mild CAD, GERD, depression and hyperlipidemia who is here today for unplanned cardiac follow up. She is followed in our office by Dr. Radford Pax. She is added onto my schedule today for the evaluation of a lump on her right neck. Coronary CTA in September 2023 with mild calcified plaque (less than 24%) in the proximal LAD. Echo September 2023 with LVEF=50-55%, trivial mitral regurgitation. She has been followed in our lipid clinic and remains on Crestor and [place].   She tells me today that she noticed this lump on her neck last week and is worried that it may be related to her carotid artery. The lump is painful to touch. It seems to be getting smaller. She had an episode of weakness last night while sitting in the kitchen. She felt like she might pass out but it passed in a few seconds. No associated palpitations. No recurrence.   Primary Care Physician: Rochel Brome, MD   Past Medical History:  Diagnosis Date   CAD (coronary artery disease), native coronary artery    Coronary CTA shows minimal CAD of pLAD <25% stenosis with Ca score 91 on 9/23   Chest pain    Depression    GERD (gastroesophageal reflux disease)    Hyperlipemia    Osteoporosis     Past Surgical History:  Procedure Laterality Date   ABDOMINAL EXPLORATION SURGERY     BREAST BIOPSY  1997   CARDIAC CATHETERIZATION     LUMBAR DISC SURGERY     TONSILLECTOMY      Current Outpatient Medications  Medication Sig Dispense Refill   amLODipine (NORVASC) 2.5 MG tablet TAKE 1 TABLET(2.5 MG) BY MOUTH DAILY 30 tablet 0   aspirin 81 MG tablet Take 81 mg by mouth daily.     Calcium Carbonate-Vitamin D (CALCIUM-VITAMIN D3 PO) Take by mouth daily.     Coenzyme Q10 (COQ-10 PO) Take 1 tablet by mouth daily.     EPINEPHrine 0.3 mg/0.3 mL IJ SOAJ injection Use as directed for life-threatening allergic  reaction. 1 each 3   Evolocumab (REPATHA SURECLICK) XX123456 MG/ML SOAJ Inject 140 mg into the skin every 14 (fourteen) days. 6 mL 3   Fish Oil OIL Take 1 tablet by mouth daily.     montelukast (SINGULAIR) 10 MG tablet TAKE 1 TABLET(10 MG) BY MOUTH AT BEDTIME 90 tablet 3   omeprazole (PRILOSEC) 40 MG capsule TAKE 1 CAPSULE(40 MG) BY MOUTH DAILY (Patient taking differently: Take 40 mg by mouth as needed. TAKE 1 CAPSULE(40 MG) BY MOUTH DAILY) 90 capsule 0   rosuvastatin (CRESTOR) 40 MG tablet Take 1 tablet (40 mg total) by mouth daily. 90 tablet 3   sertraline (ZOLOFT) 50 MG tablet TAKE 1 TABLET(50 MG) BY MOUTH DAILY 90 tablet 3   No current facility-administered medications for this visit.    Allergies  Allergen Reactions   Other Swelling    Fresh tree fruit    Social History   Socioeconomic History   Marital status: Married    Spouse name: Not on file   Number of children: 1   Years of education: Not on file   Highest education level: Some college, no degree  Occupational History   Occupation: Retired  Tobacco Use   Smoking status: Never   Smokeless tobacco: Never  Vaping Use   Vaping Use: Never  used  Substance and Sexual Activity   Alcohol use: Yes    Alcohol/week: 1.0 standard drink of alcohol    Types: 1 Glasses of wine per week    Comment: five times per week   Drug use: No   Sexual activity: Yes    Partners: Male    Birth control/protection: None  Other Topics Concern   Not on file  Social History Narrative   The patient lives in [place] with her husband.    She has one daughter who is in college at [employer].    She admits to just occasional alcohol use.  She denies any tobacco    abuse.         Social Determinants of Health   Financial Resource Strain: Not on file  Food Insecurity: Not on file  Transportation Needs: No Transportation Needs (03/02/2020)   PRAPARE - Hydrologist (Medical): No    Lack of  Transportation (Non-Medical): No  Physical Activity: Sufficiently Active (03/02/2020)   Exercise Vital Sign    Days of Exercise per Week: 5 days    Minutes of Exercise per Session: 150+ min  Stress: Not on file  Social Connections: Not on file  Intimate Partner Violence: Not At Risk (03/02/2020)   Humiliation, Afraid, Rape, and Kick questionnaire    Fear of Current or Ex-Partner: No    Emotionally Abused: No    Physically Abused: No    Sexually Abused: No    Family History  Problem Relation Age of Onset   Thrombosis Other        There is a family history of portal vein thrombosis for which family screening has been recommended at [facility]   Leukemia Mother    Heart disease Father    Kidney cancer Father    Heart disease Maternal Grandmother    Ulcerative colitis Maternal Grandfather    Lung cancer Paternal Grandmother    Liver cancer Paternal Grandmother    Heart disease Brother    Heart attack Brother     Review of Systems:  As stated in the HPI and otherwise negative.   BP 134/80   Pulse 60   Ht '5\' 1"'$  (1.549 m)   Wt 57.2 kg   SpO2 98%   BMI 23.85 kg/m   Physical Examination: General: Well developed, well nourished, NAD  HEENT: OP clear, mucus membranes moist  SKIN: warm, dry. No rashes. Neuro: No focal deficits  Musculoskeletal: Muscle strength 5/5 all ext  Psychiatric: Mood and affect normal  Neck: No JVD, no carotid bruits, no thyromegaly, no lymphadenopathy.  Lungs:Clear bilaterally, no wheezes, rhonci, crackles Cardiovascular: Regular rate and rhythm. No murmurs, gallops or rubs. Abdomen:Soft. Bowel sounds present. Non-tender.  Extremities: No lower extremity edema. Pulses are 2 + in the bilateral DP/PT.  EKG:  EKG is not ordered today. The ekg ordered today demonstrates   Echo 11/14/21: 1. Left ventricular ejection fraction, by estimation, is 50 to 55%. Left  ventricular ejection fraction by 3D volume is 51 %. The left ventricle has  low normal function. The  left ventricle has no regional wall motion  abnormalities. Left ventricular  diastolic parameters were normal.   2. Right ventricular systolic function is normal. The right ventricular  size is normal. Tricuspid regurgitation signal is inadequate for assessing  PA pressure.   3. The mitral valve is grossly normal. Trivial mitral valve  regurgitation. No evidence of mitral stenosis.   4. The aortic valve  is tricuspid. Aortic valve regurgitation is not  visualized. No aortic stenosis is present.   5. The inferior vena cava is normal in size with greater than 50%  respiratory variability, suggesting right atrial pressure of 3 mmHg.   Recent Labs: 11/01/2021: Hemoglobin 13.4; Platelets 259 11/22/2021: BUN 10; Creatinine, Ser 0.78; Potassium 4.8; Sodium 141 01/23/2022: ALT 21   Lipid Panel    Component Value Date/Time   CHOL 206 (H) 01/23/2022 1044   TRIG 66 01/23/2022 1044   HDL 76 01/23/2022 1044   CHOLHDL 2.7 01/23/2022 1044   CHOLHDL 2.5 02/15/2016 0904   VLDL 12 02/15/2016 0904   LDLCALC 118 (H) 01/23/2022 1044     Wt Readings from Last 3 Encounters:  05/11/22 57.2 kg  03/08/22 56.4 kg  11/07/21 55.2 kg    Assessment and Plan:   1. Right neck mass: This feels like a small enlarged lymph node. I have reassured her that this is likely related to inflammation but if it enlarges to alert primary care.   2. Dizziness: Likely vagal episode. BP is stable today.   Labs/ tests ordered today include:  No orders of the defined types were placed in this encounter.  Disposition:   F/U with Dr. Radford Pax as planned.   Signed, Lauree Chandler, MD, [facility] 05/11/2022 4:52 PM    [facility] [address], [place], [place]  [address] Phone: [phone]; Fax: [phone]

## 2022-05-14 ENCOUNTER — Ambulatory Visit: Payer: PPO | Attending: Cardiology

## 2022-05-14 ENCOUNTER — Other Ambulatory Visit: Payer: Self-pay

## 2022-05-14 ENCOUNTER — Other Ambulatory Visit: Payer: PPO

## 2022-05-14 DIAGNOSIS — E7800 Pure hypercholesterolemia, unspecified: Secondary | ICD-10-CM

## 2022-05-14 MED ORDER — MONTELUKAST SODIUM 10 MG PO TABS: ORAL_TABLET | ORAL | 3 refills | Status: DC

## 2022-05-15 ENCOUNTER — Ambulatory Visit: Payer: PPO | Admitting: Cardiology

## 2022-05-15 ENCOUNTER — Other Ambulatory Visit: Payer: PPO

## 2022-05-16 LAB — LIPID PANEL
Chol/HDL Ratio: 1.5 ratio (ref 0.0–4.4)
Cholesterol, Total: 161 mg/dL (ref 100–199)
HDL: 110 mg/dL (ref >39)
LDL Chol Calc (NIH): 38 mg/dL (ref 0–99)
Triglycerides: 66 mg/dL (ref 0–149)
VLDL Cholesterol Cal: 13 mg/dL (ref 5–40)

## 2022-05-16 LAB — ALT: ALT: 19 IU/L (ref 0–32)

## 2022-05-16 LAB — LIPOPROTEIN A (LPA): Lipoprotein (a): 185.9 nmol/L — ABNORMAL HIGH (ref <75.0)

## 2022-05-21 ENCOUNTER — Telehealth: Payer: Self-pay

## 2022-05-21 NOTE — Telephone Encounter (Signed)
Patient is aware of lab results.

## 2022-05-28 ENCOUNTER — Other Ambulatory Visit: Payer: Self-pay | Admitting: Family Medicine

## 2022-05-29 NOTE — Progress Notes (Unsigned)
Subjective:  Patient ID: Maria Allison, female    DOB: March 13, 1950  Age: 72 y.o. MRN: [id number]  Chief Complaint  Patient presents with   Hyperlipidemia    HPI Familial Hyperlipidemia: taking Rosuvastatin 40 mg daily, repatha 140 mg every 14 days, fish oil Eating healthy and exercise. HTN: Norvasc 2.5 mg qd, ASA 81 mg daily  GERD: Omeprazole 40 mg daily  Hot Flashes: Zoloft 50 mg daily     05/30/2022    9:06 AM 03/12/2022    3:05 AM 03/12/2021   12:12 AM 03/02/2020   12:27 PM 03/02/2020   10:20 AM  Depression screen PHQ 2/9  Decreased Interest 0 0 0 0 0  Down, Depressed, Hopeless 0 0 0 0 0  PHQ - 2 Score 0 0 0 0 0         03/02/2020   10:22 AM 03/09/2020   11:31 PM 03/12/2022    3:04 AM 05/30/2022    9:06 AM  [place] in the past year? 0  1 1  Number of falls in past year - Comments    [place] off ladder  Was there an injury with Fall? 0  0 0  Fall Risk Category Calculator 0  1 1  Fall Risk Category (Retired) Low  Low   (RETIRED) Patient Fall Risk Level Low fall risk  Low fall risk   Patient at Risk for Falls Due to Other (Comment) No Fall Risks No Fall Risks No Fall Risks  Fall risk Follow up  Falls evaluation completed;Falls prevention discussed Falls evaluation completed;Falls prevention discussed Falls evaluation completed      Review of Systems  Constitutional:  Negative for chills, fatigue and fever.  HENT:  Negative for congestion, ear pain, rhinorrhea and sore throat.   Respiratory:  Negative for cough and shortness of breath.   Cardiovascular:  Negative for chest pain.  Gastrointestinal:  Negative for abdominal pain, constipation, diarrhea, nausea and vomiting.  Genitourinary:  Negative for dysuria and urgency.  Musculoskeletal:  Negative for back pain and myalgias.  Neurological:  Negative for dizziness, weakness, light-headedness and headaches.  Psychiatric/Behavioral:  Negative for dysphoric mood. The patient is not nervous/anxious.      Current Outpatient Medications on File Prior to Visit  Medication Sig Dispense Refill   aspirin 81 MG tablet Take 81 mg by mouth daily.     Calcium Carbonate-Vitamin D (CALCIUM-VITAMIN D3 PO) Take by mouth daily.     Coenzyme Q10 (COQ-10 PO) Take 1 tablet by mouth daily.     EPINEPHrine 0.3 mg/0.3 mL IJ SOAJ injection Use as directed for life-threatening allergic reaction. 1 each 3   Evolocumab (REPATHA SURECLICK) [id number] MG/ML SOAJ Inject 140 mg into the skin every 14 (fourteen) days. 6 mL 3   Fish Oil OIL Take 1 tablet by mouth daily.     montelukast (SINGULAIR) 10 MG tablet TAKE 1 TABLET(10 MG) BY MOUTH AT BEDTIME 90 tablet 3   No current facility-administered medications on file prior to visit.   Past Medical History:  Diagnosis Date   CAD (coronary artery disease), native coronary artery    Coronary CTA shows minimal CAD of pLAD <25% stenosis with Ca score 91 on 9/23   Chest pain    Depression    GERD (gastroesophageal reflux disease)    Hyperlipemia    Osteoporosis    Past Surgical History:  Procedure Laterality Date   [place]  CARDIAC CATHETERIZATION     LUMBAR DISC SURGERY     TONSILLECTOMY      Family History  Problem Relation Age of Onset   Thrombosis Other        There is a family history of portal vein thrombosis for which family screening has been recommended at [facility]   Leukemia Mother    Heart disease Father    Kidney cancer Father    Heart disease Maternal Grandmother    Ulcerative colitis Maternal Grandfather    Lung cancer Paternal Grandmother    Liver cancer Paternal Grandmother    Heart disease Brother    Heart attack Brother    Social History   Socioeconomic History   Marital status: Married    Spouse name: Not on file   Number of children: 1   Years of education: Not on file   Highest education level: Some college, no degree  Occupational History   Occupation: Retired  Tobacco Use   Smoking  status: Never   Smokeless tobacco: Never  Vaping Use   Vaping Use: Never used  Substance and Sexual Activity   Alcohol use: Yes    Alcohol/week: 1.0 standard drink of alcohol    Types: 1 Glasses of wine per week    Comment: five times per week   Drug use: No   Sexual activity: Yes    Partners: Male    Birth control/protection: None  Other Topics Concern   Not on file  Social History Narrative   The patient lives in [place] with her husband.    She has one daughter who is in college at [employer].    She admits to just occasional alcohol use.  She denies any tobacco    abuse.         Social Determinants of Health   Financial Resource Strain: Not on file  Food Insecurity: Not on file  Transportation Needs: No Transportation Needs (03/02/2020)   PRAPARE - Hydrologist (Medical): No    Lack of Transportation (Non-Medical): No  Physical Activity: Sufficiently Active (03/02/2020)   Exercise Vital Sign    Days of Exercise per Week: 5 days    Minutes of Exercise per Session: 150+ min  Stress: Not on file  Social Connections: Not on file    Objective:  BP 118/76   Temp (!) 96.5 F (35.8 C)   Ht 5\' 1"  (1.549 m)   Wt 123 lb (55.8 kg)   BMI 23.24 kg/m      05/30/2022    9:03 AM 05/11/2022    4:19 PM 03/08/2022    2:16 PM  BP/Weight  Systolic BP 123456 [id number]  Diastolic BP 76 80 80  Wt. (Lbs) 123 126.2 124.4  BMI 23.24 kg/m2 23.85 kg/m2 23.51 kg/m2    Physical Exam Vitals reviewed.  Constitutional:      Appearance: Normal appearance. She is normal weight.  Neck:     Vascular: No carotid bruit.     Comments: BL symmetrical posterior cervical lymph nodes. Nontender. < 1 in in diameter Cardiovascular:     Rate and Rhythm: Normal rate and regular rhythm.     Heart sounds: Normal heart sounds.  Pulmonary:     Effort: Pulmonary effort is normal. No respiratory distress.     Breath sounds: Normal breath sounds.   Abdominal:     General: Abdomen is flat. Bowel sounds are normal.     Palpations: Abdomen is soft.  Tenderness: There is no abdominal tenderness.  Musculoskeletal:     Cervical back: No tenderness.  Neurological:     Mental Status: She is alert and oriented to person, place, and time.  Psychiatric:        Mood and Affect: Mood normal.        Behavior: Behavior normal.     Diabetic Foot Exam - Simple   No data filed      Lab Results  Component Value Date   WBC 3.8 05/30/2022   HGB 13.3 05/30/2022   HCT 40.3 05/30/2022   PLT 226 05/30/2022   GLUCOSE 103 (H) 05/30/2022   CHOL 161 05/14/2022   TRIG 66 05/14/2022   HDL 110 05/14/2022   LDLCALC 38 05/14/2022   ALT 21 05/30/2022   AST 32 05/30/2022   NA 143 05/30/2022   K 4.8 05/30/2022   CL 104 05/30/2022   CREATININE 0.74 05/30/2022   BUN 13 05/30/2022   CO2 23 05/30/2022   TSH 3.080 03/07/2021      Assessment & Plan:    Familial hypercholesterolemia Assessment & Plan: Well controlled.  No changes to medicines. taking Rosuvastatin 40 mg daily, repatha 140 mg every 14 days, fish oil. Continue to work on eating a healthy diet and exercise.  Labs drawn today.    Orders: -     Comprehensive metabolic panel -     CBC with Differential/Platelet  Gastroesophageal reflux disease, unspecified whether esophagitis present Assessment & Plan: The current medical regimen is effective;  continue present plan and medications.  Omeprazole 40 mg daily    Encounter for hepatitis C screening test for low risk patient -     HCV Ab w Reflex to Quant PCR  Osteoporosis screening Assessment & Plan: Ordered DEXA.  Orders: -     DG Bone Density; Future  Other orders -     Omeprazole; Take 1 capsule (40 mg total) by mouth as needed. TAKE 1 CAPSULE(40 MG) BY MOUTH DAILY  Dispense: 90 capsule; Refill: 1 -     amLODIPine Besylate; TAKE 1 TABLET(2.5 MG) BY MOUTH DAILY  Dispense: 90 tablet; Refill: 3 -     Sertraline HCl;  TAKE 1 TABLET(50 MG) BY MOUTH DAILY  Dispense: 90 tablet; Refill: 3 -     Rosuvastatin Calcium; Take 1 tablet (40 mg total) by mouth daily.  Dispense: 90 tablet; Refill: 1 -     Interpretation:     Meds ordered this encounter  Medications   omeprazole (PRILOSEC) 40 MG capsule    Sig: Take 1 capsule (40 mg total) by mouth as needed. TAKE 1 CAPSULE(40 MG) BY MOUTH DAILY    Dispense:  90 capsule    Refill:  1   amLODipine (NORVASC) 2.5 MG tablet    Sig: TAKE 1 TABLET(2.5 MG) BY MOUTH DAILY    Dispense:  90 tablet    Refill:  3   sertraline (ZOLOFT) 50 MG tablet    Sig: TAKE 1 TABLET(50 MG) BY MOUTH DAILY    Dispense:  90 tablet    Refill:  3   rosuvastatin (CRESTOR) 40 MG tablet    Sig: Take 1 tablet (40 mg total) by mouth daily.    Dispense:  90 tablet    Refill:  1    Orders Placed This Encounter  Procedures   DG Bone Density   Comprehensive metabolic panel   CBC with Differential/Platelet   HCV Ab w Reflex to Quant PCR   Interpretation:  Follow-up: Return in about 6 months (around 11/30/2022) for chronic fasting.   I,Marla I Leal-Borjas,acting as a scribe for Rochel Brome, MD.,have documented all relevant documentation on the behalf of Rochel Brome, MD,as directed by  Rochel Brome, MD while in the presence of Rochel Brome, MD.   An After Visit Summary was printed and given to the patient.  Rochel Brome, MD [facility] [phone]

## 2022-05-29 NOTE — Assessment & Plan Note (Addendum)
The current medical regimen is effective;  continue present plan and medications. Omeprazole 40 mg daily. 

## 2022-05-29 NOTE — Assessment & Plan Note (Addendum)
Well controlled.  No changes to medicines. taking Rosuvastatin 40 mg daily, repatha 140 mg every 14 days, fish oil. Continue to work on eating a healthy diet and exercise.  Labs drawn today.

## 2022-05-30 ENCOUNTER — Encounter: Payer: Self-pay | Admitting: Family Medicine

## 2022-05-30 ENCOUNTER — Ambulatory Visit (INDEPENDENT_AMBULATORY_CARE_PROVIDER_SITE_OTHER): Payer: PPO | Admitting: Family Medicine

## 2022-05-30 VITALS — BP 118/76 | Temp 96.5°F | Ht 61.0 in | Wt 123.0 lb

## 2022-05-30 DIAGNOSIS — N951 Menopausal and female climacteric states: Secondary | ICD-10-CM | POA: Diagnosis not present

## 2022-05-30 DIAGNOSIS — I10 Essential (primary) hypertension: Secondary | ICD-10-CM

## 2022-05-30 DIAGNOSIS — E782 Mixed hyperlipidemia: Secondary | ICD-10-CM

## 2022-05-30 DIAGNOSIS — K219 Gastro-esophageal reflux disease without esophagitis: Secondary | ICD-10-CM | POA: Diagnosis not present

## 2022-05-30 DIAGNOSIS — Z13820 Encounter for screening for osteoporosis: Secondary | ICD-10-CM

## 2022-05-30 DIAGNOSIS — Z1159 Encounter for screening for other viral diseases: Secondary | ICD-10-CM

## 2022-05-30 DIAGNOSIS — E7801 Familial hypercholesterolemia: Secondary | ICD-10-CM | POA: Diagnosis not present

## 2022-05-30 DIAGNOSIS — I2510 Atherosclerotic heart disease of native coronary artery without angina pectoris: Secondary | ICD-10-CM | POA: Diagnosis not present

## 2022-05-30 HISTORY — DX: Encounter for screening for other viral diseases: Z11.59

## 2022-05-30 HISTORY — DX: Encounter for screening for osteoporosis: Z13.820

## 2022-05-30 MED ORDER — AMLODIPINE BESYLATE 2.5 MG PO TABS: ORAL_TABLET | ORAL | 3 refills | Status: DC

## 2022-05-30 MED ORDER — OMEPRAZOLE 40 MG PO CPDR: 40.0000 mg | DELAYED_RELEASE_CAPSULE | ORAL | 1 refills | Status: DC | PRN

## 2022-05-30 MED ORDER — SERTRALINE HCL 50 MG PO TABS: ORAL_TABLET | ORAL | 3 refills | Status: DC

## 2022-05-30 MED ORDER — ROSUVASTATIN CALCIUM 40 MG PO TABS: 40.0000 mg | ORAL_TABLET | Freq: Every day | ORAL | 1 refills | Status: DC

## 2022-05-31 LAB — CBC WITH DIFFERENTIAL/PLATELET
Basophils Absolute: 0 10*3/uL (ref 0.0–0.2)
Basos: 1 %
EOS (ABSOLUTE): 0.1 10*3/uL (ref 0.0–0.4)
Eos: 2 %
Hematocrit: 40.3 % (ref 34.0–46.6)
Hemoglobin: 13.3 g/dL (ref 11.1–15.9)
Immature Grans (Abs): 0 10*3/uL (ref 0.0–0.1)
Immature Granulocytes: 0 %
Lymphocytes Absolute: 1.5 10*3/uL (ref 0.7–3.1)
Lymphs: 40 %
MCH: 30.5 pg (ref 26.6–33.0)
MCHC: 33 g/dL (ref 31.5–35.7)
MCV: 92 fL (ref 79–97)
Monocytes Absolute: 0.4 10*3/uL (ref 0.1–0.9)
Monocytes: 10 %
Neutrophils Absolute: 1.8 10*3/uL (ref 1.4–7.0)
Neutrophils: 47 %
Platelets: 226 10*3/uL (ref 150–450)
RBC: 4.36 x10E6/uL (ref 3.77–5.28)
RDW: 12.8 % (ref 11.7–15.4)
WBC: 3.8 10*3/uL (ref 3.4–10.8)

## 2022-05-31 LAB — COMPREHENSIVE METABOLIC PANEL
ALT: 21 IU/L (ref 0–32)
AST: 32 IU/L (ref 0–40)
Albumin/Globulin Ratio: 2.2 (ref 1.2–2.2)
Albumin: 4.8 g/dL (ref 3.8–4.8)
Alkaline Phosphatase: 75 IU/L (ref 44–121)
BUN/Creatinine Ratio: 18 (ref 12–28)
BUN: 13 mg/dL (ref 8–27)
Bilirubin Total: 0.5 mg/dL (ref 0.0–1.2)
CO2: 23 mmol/L (ref 20–29)
Calcium: 10 mg/dL (ref 8.7–10.3)
Chloride: 104 mmol/L (ref 96–106)
Creatinine, Ser: 0.74 mg/dL (ref 0.57–1.00)
Globulin, Total: 2.2 g/dL (ref 1.5–4.5)
Glucose: 103 mg/dL — ABNORMAL HIGH (ref 70–99)
Potassium: 4.8 mmol/L (ref 3.5–5.2)
Sodium: 143 mmol/L (ref 134–144)
Total Protein: 7 g/dL (ref 6.0–8.5)
eGFR: 86 mL/min/{1.73_m2} (ref >59)

## 2022-05-31 LAB — HCV AB W REFLEX TO QUANT PCR: HCV Ab: NONREACTIVE

## 2022-06-02 NOTE — Assessment & Plan Note (Signed)
Ordered DEXA

## 2022-06-03 DIAGNOSIS — N951 Menopausal and female climacteric states: Secondary | ICD-10-CM | POA: Insufficient documentation

## 2022-06-03 DIAGNOSIS — I10 Essential (primary) hypertension: Secondary | ICD-10-CM

## 2022-06-03 HISTORY — DX: Menopausal and female climacteric states: N95.1

## 2022-06-03 HISTORY — DX: Essential (primary) hypertension: I10

## 2022-06-03 NOTE — Assessment & Plan Note (Signed)
   Continue zoloft 50 mg daily

## 2022-06-05 DIAGNOSIS — Z1231 Encounter for screening mammogram for malignant neoplasm of breast: Secondary | ICD-10-CM | POA: Diagnosis not present

## 2022-06-18 DIAGNOSIS — L578 Other skin changes due to chronic exposure to nonionizing radiation: Secondary | ICD-10-CM | POA: Diagnosis not present

## 2022-06-18 DIAGNOSIS — L821 Other seborrheic keratosis: Secondary | ICD-10-CM | POA: Diagnosis not present

## 2022-06-18 DIAGNOSIS — L820 Inflamed seborrheic keratosis: Secondary | ICD-10-CM | POA: Diagnosis not present

## 2022-07-18 DIAGNOSIS — Z01419 Encounter for gynecological examination (general) (routine) without abnormal findings: Secondary | ICD-10-CM | POA: Diagnosis not present

## 2022-08-01 ENCOUNTER — Encounter: Payer: Self-pay | Admitting: Family Medicine

## 2022-08-01 DIAGNOSIS — M8589 Other specified disorders of bone density and structure, multiple sites: Secondary | ICD-10-CM | POA: Diagnosis not present

## 2022-08-01 DIAGNOSIS — M8580 Other specified disorders of bone density and structure, unspecified site: Secondary | ICD-10-CM | POA: Diagnosis not present

## 2022-08-01 LAB — HM DEXA SCAN

## 2022-08-26 ENCOUNTER — Other Ambulatory Visit: Payer: Self-pay | Admitting: Family Medicine

## 2022-08-26 DIAGNOSIS — K219 Gastro-esophageal reflux disease without esophagitis: Secondary | ICD-10-CM

## 2022-09-07 ENCOUNTER — Telehealth: Payer: Self-pay | Admitting: Cardiology

## 2022-09-07 NOTE — Telephone Encounter (Signed)
I spoke with patient. She reports she received text message that she was due for appointment.  She reports she thinks lab work was being checked every 3 months due to Repatha.  Chart reviewed and patient is due for follow up in office later this year.  Appointment made for patient to see Jari Favre, PA on August 29.  Will check with lipid clinic to see if any lab work needed.

## 2022-09-07 NOTE — Telephone Encounter (Signed)
Patient states that she calling in about getting labs done but there is no orders. Please advise

## 2022-09-07 NOTE — Telephone Encounter (Signed)
Lipids excellent in March after starting Repatha, can check lipid panel annually now, does not need Q3M labs for this.

## 2022-09-10 NOTE — Telephone Encounter (Signed)
Called patient and let her know no labs needed for lipids except annually at this point.

## 2022-10-01 DIAGNOSIS — H43813 Vitreous degeneration, bilateral: Secondary | ICD-10-CM | POA: Diagnosis not present

## 2022-11-06 ENCOUNTER — Telehealth: Payer: Self-pay | Admitting: Physician Assistant

## 2022-11-06 NOTE — Telephone Encounter (Signed)
Pt asked if Maria Allison, [place] will want labs prior to her appt Thursday.   Pt is aware that last phone note stated she didn't need labs every 3 months now, but she wants to be sure.

## 2022-11-06 NOTE — Telephone Encounter (Signed)
Patient ad labs done in March of 2024. She would like to know if you needed anymore labs before her appointment? I did see in phone note that she will only need labs annually but she wanted to check with you.

## 2022-11-08 ENCOUNTER — Ambulatory Visit: Payer: PPO | Attending: Physician Assistant | Admitting: Physician Assistant

## 2022-11-08 ENCOUNTER — Encounter: Payer: Self-pay | Admitting: Physician Assistant

## 2022-11-08 VITALS — BP 110/68 | HR 64 | Ht 61.0 in | Wt 124.0 lb

## 2022-11-08 DIAGNOSIS — R0602 Shortness of breath: Secondary | ICD-10-CM | POA: Diagnosis not present

## 2022-11-08 DIAGNOSIS — R079 Chest pain, unspecified: Secondary | ICD-10-CM

## 2022-11-08 DIAGNOSIS — F339 Major depressive disorder, recurrent, unspecified: Secondary | ICD-10-CM | POA: Diagnosis not present

## 2022-11-08 DIAGNOSIS — E785 Hyperlipidemia, unspecified: Secondary | ICD-10-CM

## 2022-11-08 DIAGNOSIS — R931 Abnormal findings on diagnostic imaging of heart and coronary circulation: Secondary | ICD-10-CM

## 2022-11-08 DIAGNOSIS — I2510 Atherosclerotic heart disease of native coronary artery without angina pectoris: Secondary | ICD-10-CM

## 2022-11-08 MED ORDER — ROSUVASTATIN CALCIUM 20 MG PO TABS: 20.0000 mg | ORAL_TABLET | Freq: Every day | ORAL | 3 refills | Status: DC

## 2022-11-08 NOTE — Progress Notes (Signed)
Cardiology Office Note:  .   Date:  11/08/2022  ID:  Maria Allison, DOB 07/09/1950, MRN [id number] PCP: Blane Ohara, MD  [facility] Providers Cardiologist:  Armanda Magic, MD {  History of Present Illness: .   Maria Allison is a 72 y.o. female with a past medical history of mild CAD, GERD, depression and hyperlipidemia who is here for follow-up.  Was seen by Dr. Clifton James for the evaluation of a lump in her right neck.  Coronary CTA September 2023 showed mild calcified plaque (less than 24%) in the proximal LAD.  Echo September 2023 with LVEF 50 to 55%, trivial MR.  She has been followed by lipid clinic and remains on Crestor and Repatha.  At her last appointment she noticed a neck lump that appeared the week prior.  Felt it might be related to her carotid artery.  Lump is painful to touch.  Felt like it was getting smaller.  Episodes of weakness while sitting in her kitchen.  [place] like she may pass out but it passed few seconds later.  No associated palpitations.  No recurrence.  Today, she tells me that there has been no change in her cardiac history.  Feels like the lumps in her neck come and go and has some pain associated with them.  Had her PCP check to see if it was lymphadenopathy.  Had some weight gain and wondered if Repatha caused it.  Not listed on the expected side effects on their website.  Would continue current exercise and continue low-sodium, heart healthy diet.  Blood pressure well-controlled today.  She wants to try a lower dose of Crestor to see if she would still have numbers in range.  Will plan to reduce her dose to 20 and repeat lab work in a few months.  Reports no shortness of breath nor dyspnea on exertion. Reports no chest pain, pressure, or tightness. No edema, orthopnea, PND. Reports no palpitations.    ROS: Pertinent ROS in HPI  Studies Reviewed: .        Echo 11/14/21: 1. Left ventricular ejection fraction, by estimation, is 50 to 55%. Left   ventricular ejection fraction by 3D volume is 51 %. The left ventricle has  low normal function. The left ventricle has no regional wall motion  abnormalities. Left ventricular  diastolic parameters were normal.   2. Right ventricular systolic function is normal. The right ventricular  size is normal. Tricuspid regurgitation signal is inadequate for assessing  PA pressure.   3. The mitral valve is grossly normal. Trivial mitral valve  regurgitation. No evidence of mitral stenosis.   4. The aortic valve is tricuspid. Aortic valve regurgitation is not  visualized. No aortic stenosis is present.   5. The inferior vena cava is normal in size with greater than 50%  respiratory variability, suggesting right atrial pressure of 3 mmHg.    Physical Exam:   VS:  BP 110/68   Pulse 64   Ht 5\' 1"  (1.549 m)   Wt 124 lb (56.2 kg)   SpO2 94%   BMI 23.43 kg/m    Wt Readings from Last 3 Encounters:  11/08/22 124 lb (56.2 kg)  05/30/22 123 lb (55.8 kg)  05/11/22 126 lb 3.2 oz (57.2 kg)    GEN: Well nourished, well developed in no acute distress NECK: No JVD; No carotid bruits CARDIAC: RRR, no murmurs, rubs, gallops RESPIRATORY:  Clear to auscultation without rales, wheezing or rhonchi  ABDOMEN: Soft, non-tender, non-distended  EXTREMITIES:  No edema; No deformity   ASSESSMENT AND PLAN: .   Dizziness -this has resolved -Blood pressure well-controlled today, 110/68 -Would recommend continuing amlodipine 2.5 mg daily, aspirin 81 mg daily, Repatha 140 mg/mL every 14 days, fish oil, Crestor reduced to 20 mg daily  HLD -Recent lab work with LDL 38, HDL 110, total cholesterol 161, triglycerides 66, all numbers at goal -Patient would like to try a lower dose of Crestor, 20 mg -Would recheck a lipid panel in March 2025  HTN   -Blood pressure well-controlled today on 2.5 mg of amlodipine -Maybe at some point in future we can try her off amlodipine and have her check her blood pressure daily and  see if blood pressure stays less than 120/80    Dispo: She has requested to follow-up with Dr. Clifton James in 6 months  Signed, Sharlene Dory, PA-C

## 2022-11-08 NOTE — Patient Instructions (Signed)
Medication Instructions:  Decrease crestor to 20 mg daily. *If you need a refill on your cardiac medications before your next appointment, please call your pharmacy*  Lab Work: Fasting lipids, lft's-March 2025 If you have labs (blood work) drawn today and your tests are completely normal, you will receive your results only by: MyChart Message (if you have MyChart) OR A paper copy in the mail If you have any lab test that is abnormal or we need to change your treatment, we will call you to review the results.  Follow-Up: At [facility], you and your health needs are our priority.  As part of our continuing mission to provide you with exceptional heart care, we have created designated Provider Care Teams.  These Care Teams include your primary Cardiologist (physician) and Advanced Practice Providers (APPs -  Physician Assistants and Nurse Practitioners) who all work together to provide you with the care you need, when you need it.  Your next appointment:   6 month(s)  Provider:   Dr Clifton James  Heart-Healthy Eating Plan Many factors influence your heart health, including eating and exercise habits. Heart health is also called coronary health. Coronary risk increases with abnormal blood fat (lipid) levels. A heart-healthy eating plan includes limiting unhealthy fats, increasing healthy fats, limiting salt (sodium) intake, and making other diet and lifestyle changes. What is my plan? Your health care provider may recommend that: You limit your fat intake to _________% or less of your total calories each day. You limit your saturated fat intake to _________% or less of your total calories each day. You limit the amount of cholesterol in your diet to less than _________ mg per day. You limit the amount of sodium in your diet to less than _________ mg per day. What are tips for following this plan? Cooking Cook foods using methods other than frying. Baking, boiling, grilling, and  broiling are all good options. Other ways to reduce fat include: Removing the skin from poultry. Removing all visible fats from meats. Steaming vegetables in water or broth. Meal planning  At meals, imagine dividing your plate into fourths: Fill one-half of your plate with vegetables and green salads. Fill one-fourth of your plate with whole grains. Fill one-fourth of your plate with lean protein foods. Eat 2-4 cups of vegetables per day. One cup of vegetables equals 1 cup (91 g) broccoli or cauliflower florets, 2 medium carrots, 1 large bell pepper, 1 large sweet potato, 1 large tomato, 1 medium white potato, 2 cups (150 g) raw leafy greens. Eat 1-2 cups of fruit per day. One cup of fruit equals 1 small apple, 1 large banana, 1 cup (237 g) mixed fruit, 1 large orange,  cup (82 g) dried fruit, 1 cup (240 mL) 100% fruit juice. Eat more foods that contain soluble fiber. Examples include apples, broccoli, carrots, beans, peas, and barley. Aim to get 25-30 g of fiber per day. Increase your consumption of legumes, nuts, and seeds to 4-5 servings per week. One serving of dried beans or legumes equals  cup (90 g) cooked, 1 serving of nuts is  oz (12 almonds, 24 pistachios, or 7 walnut halves), and 1 serving of seeds equals  oz (8 g). Fats Choose healthy fats more often. Choose monounsaturated and polyunsaturated fats, such as olive and canola oils, avocado oil, flaxseeds, walnuts, almonds, and seeds. Eat more omega-3 fats. Choose salmon, mackerel, sardines, tuna, flaxseed oil, and ground flaxseeds. Aim to eat fish at least 2 times each week. Check  food labels carefully to identify foods with trans fats or high amounts of saturated fat. Limit saturated fats. These are found in animal products, such as meats, butter, and cream. Plant sources of saturated fats include palm oil, palm kernel oil, and coconut oil. Avoid foods with partially hydrogenated oils in them. These contain trans fats. Examples  are stick margarine, some tub margarines, cookies, crackers, and other baked goods. Avoid fried foods. General information Eat more home-cooked food and less restaurant, buffet, and fast food. Limit or avoid alcohol. Limit foods that are high in added sugar and simple starches such as foods made using white refined flour (white breads, pastries, sweets). Lose weight if you are overweight. Losing just 5-10% of your body weight can help your overall health and prevent diseases such as diabetes and heart disease. Monitor your sodium intake, especially if you have high blood pressure. Talk with your health care provider about your sodium intake. Try to incorporate more vegetarian meals weekly. What foods should I eat? Fruits All fresh, canned (in natural juice), or frozen fruits. Vegetables Fresh or frozen vegetables (raw, steamed, roasted, or grilled). Green salads. Grains Most grains. Choose whole wheat and whole grains most of the time. Rice and pasta, including brown rice and pastas made with whole wheat. Meats and other proteins Lean, well-trimmed beef, veal, pork, and lamb. Chicken and [place] without skin. All fish and shellfish. Wild duck, rabbit, pheasant, and venison. Egg whites or low-cholesterol egg substitutes. Dried beans, peas, lentils, and tofu. Seeds and most nuts. Dairy Low-fat or nonfat cheeses, including ricotta and mozzarella. Skim or 1% milk (liquid, powdered, or evaporated). Buttermilk made with low-fat milk. Nonfat or low-fat yogurt. Fats and oils Non-hydrogenated (trans-free) margarines. Vegetable oils, including soybean, sesame, sunflower, olive, avocado, peanut, safflower, corn, canola, and cottonseed. Salad dressings or mayonnaise made with a vegetable oil. Beverages Water (mineral or sparkling). Coffee and tea. Unsweetened ice tea. Diet beverages. Sweets and desserts Sherbet, gelatin, and fruit ice. Small amounts of dark chocolate. Limit all sweets and  desserts. Seasonings and condiments All seasonings and condiments. The items listed above may not be a complete list of foods and beverages you can eat. Contact a dietitian for more options. What foods should I avoid? Fruits Canned fruit in heavy syrup. Fruit in cream or butter sauce. Fried fruit. Limit coconut. Vegetables Vegetables cooked in cheese, cream, or butter sauce. Fried vegetables. Grains Breads made with saturated or trans fats, oils, or whole milk. Croissants. Sweet rolls. Donuts. High-fat crackers, such as cheese crackers and chips. Meats and other proteins Fatty meats, such as hot dogs, ribs, sausage, bacon, rib-eye roast or steak. High-fat deli meats, such as salami and bologna. Caviar. Domestic duck and goose. Organ meats, such as liver. Dairy Cream, sour cream, cream cheese, and creamed cottage cheese. Whole-milk cheeses. Whole or 2% milk (liquid, evaporated, or condensed). Whole buttermilk. Cream sauce or high-fat cheese sauce. Whole-milk yogurt. Fats and oils Meat fat, or shortening. Cocoa butter, hydrogenated oils, palm oil, coconut oil, palm kernel oil. Solid fats and shortenings, including bacon fat, salt pork, lard, and butter. Nondairy cream substitutes. Salad dressings with cheese or sour cream. Beverages Regular sodas and any drinks with added sugar. Sweets and desserts Frosting. Pudding. Cookies. Cakes. Pies. Milk chocolate or white chocolate. Buttered syrups. Full-fat ice cream or ice cream drinks. The items listed above may not be a complete list of foods and beverages to avoid. Contact a dietitian for more information. Summary Heart-healthy meal planning includes limiting unhealthy  fats, increasing healthy fats, limiting salt (sodium) intake and making other diet and lifestyle changes. Lose weight if you are overweight. Losing just 5-10% of your body weight can help your overall health and prevent diseases such as diabetes and heart disease. Focus on eating a  balance of foods, including fruits and vegetables, low-fat or nonfat dairy, lean protein, nuts and legumes, whole grains, and heart-healthy oils and fats. This information is not intended to replace advice given to you by your health care provider. Make sure you discuss any questions you have with your health care provider. Document Revised: 04/03/2021 Document Reviewed: 04/03/2021 Elsevier Patient Education  2024 [employer] Low-Sodium Eating Plan Salt (sodium) helps you keep a healthy balance of fluids in your body. Too much sodium can raise your blood pressure. It can also cause fluid and waste to be held in your body. Your health care provider or dietitian may recommend a low-sodium eating plan if you have high blood pressure (hypertension), kidney disease, liver disease, or heart failure. Eating less sodium can help lower your blood pressure and reduce swelling. It can also protect your heart, liver, and kidneys. What are tips for following this plan? Reading food labels  Check food labels for the amount of sodium per serving. If you eat more than one serving, you must multiply the listed amount by the number of servings. Choose foods with less than 140 milligrams (mg) of sodium per serving. Avoid foods with 300 mg of sodium or more per serving. Always check how much sodium is in a product, even if the label says "unsalted" or "no salt added." Shopping  Buy products labeled as "low-sodium" or "no salt added." Buy fresh foods. Avoid canned foods and pre-made or frozen meals. Avoid canned, cured, or processed meats. Buy breads that have less than 80 mg of sodium per slice. Cooking  Eat more home-cooked food. Try to eat less restaurant, buffet, and fast food. Try not to add salt when you cook. Use salt-free seasonings or herbs instead of table salt or sea salt. Check with your provider or pharmacist before using salt substitutes. Cook with plant-based oils, such as canola, sunflower, or  olive oil. Meal planning When eating at a restaurant, ask if your food can be made with less salt or no salt. Avoid dishes labeled as brined, pickled, cured, or smoked. Avoid dishes made with soy sauce, miso, or teriyaki sauce. Avoid foods that have monosodium glutamate (MSG) in them. MSG may be added to some restaurant food, sauces, soups, bouillon, and canned foods. Make meals that can be grilled, baked, poached, roasted, or steamed. These are often made with less sodium. General information Try to limit your sodium intake to 1,500-2,300 mg each day, or the amount told by your provider. What foods should I eat? Fruits Fresh, frozen, or canned fruit. Fruit juice. Vegetables Fresh or frozen vegetables. "No salt added" canned vegetables. "No salt added" tomato sauce and paste. Low-sodium or reduced-sodium tomato and vegetable juice. Grains Low-sodium cereals, such as oats, puffed wheat and rice, and shredded wheat. Low-sodium crackers. Unsalted rice. Unsalted pasta. Low-sodium bread. Whole grain breads and whole grain pasta. Meats and other proteins Fresh or frozen meat, poultry, seafood, and fish. These should have no added salt. Low-sodium canned tuna and salmon. Unsalted nuts. Dried peas, beans, and lentils without added salt. Unsalted canned beans. Eggs. Unsalted nut butters. Dairy Milk. Soy milk. Cheese that is naturally low in sodium, such as ricotta cheese, fresh mozzarella, or Swiss cheese. Low-sodium  or reduced-sodium cheese. Cream cheese. Yogurt. Seasonings and condiments Fresh and dried herbs and spices. Salt-free seasonings. Low-sodium mustard and ketchup. Sodium-free salad dressing. Sodium-free light mayonnaise. Fresh or refrigerated horseradish. Lemon juice. Vinegar. Other foods Homemade, reduced-sodium, or low-sodium soups. Unsalted popcorn and pretzels. Low-salt or salt-free chips. The items listed above may not be all the foods and drinks you can have. Talk to a dietitian to  learn more. What foods should I avoid? Vegetables Sauerkraut, pickled vegetables, and relishes. Olives. [place] fries. Onion rings. Regular canned vegetables, except low-sodium or reduced-sodium items. Regular canned tomato sauce and paste. Regular tomato and vegetable juice. Frozen vegetables in sauces. Grains Instant hot cereals. Bread stuffing, pancake, and biscuit mixes. Croutons. Seasoned rice or pasta mixes. Noodle soup cups. Boxed or frozen macaroni and cheese. Regular salted crackers. Self-rising flour. Meats and other proteins Meat or fish that is salted, canned, smoked, spiced, or pickled. Precooked or cured meat, such as sausages or meat loaves. Tomasa Blase. Ham. Pepperoni. Hot dogs. Corned beef. Chipped beef. Salt pork. Jerky. Pickled herring, anchovies, and sardines. Regular canned tuna. Salted nuts. Dairy Processed cheese and cheese spreads. Hard cheeses. Cheese curds. Blue cheese. Feta cheese. String cheese. Regular cottage cheese. Buttermilk. Canned milk. Fats and oils Salted butter. Regular margarine. Ghee. Bacon fat. Seasonings and condiments Onion salt, garlic salt, seasoned salt, table salt, and sea salt. Canned and packaged gravies. Worcestershire sauce. Tartar sauce. Barbecue sauce. Teriyaki sauce. Soy sauce, including reduced-sodium soy sauce. Steak sauce. Fish sauce. Oyster sauce. Cocktail sauce. Horseradish that you find on the shelf. Regular ketchup and mustard. Meat flavorings and tenderizers. Bouillon cubes. Hot sauce. Pre-made or packaged marinades. Pre-made or packaged taco seasonings. Relishes. Regular salad dressings. Salsa. Other foods Salted popcorn and pretzels. Corn chips and puffs. Potato and tortilla chips. Canned or dried soups. Pizza. Frozen entrees and pot pies. The items listed above may not be all the foods and drinks you should avoid. Talk to a dietitian to learn more. This information is not intended to replace advice given to you by your health care provider.  Make sure you discuss any questions you have with your health care provider. Document Revised: 03/15/2022 Document Reviewed: 03/15/2022 Elsevier Patient Education  2024 [employer].

## 2022-11-25 ENCOUNTER — Other Ambulatory Visit: Payer: Self-pay | Admitting: Family Medicine

## 2022-11-25 DIAGNOSIS — E7801 Familial hypercholesterolemia: Secondary | ICD-10-CM

## 2022-11-25 DIAGNOSIS — I2510 Atherosclerotic heart disease of native coronary artery without angina pectoris: Secondary | ICD-10-CM

## 2022-11-26 ENCOUNTER — Other Ambulatory Visit (HOSPITAL_COMMUNITY): Payer: Self-pay

## 2022-11-26 ENCOUNTER — Telehealth: Payer: Self-pay | Admitting: Pharmacy Technician

## 2022-11-26 NOTE — Telephone Encounter (Signed)
Pharmacy Patient Advocate Encounter   Received notification from CoverMyMeds that prior authorization for repatha is required/requested.   Insurance verification completed.   The patient is insured through [employer]/ [employer] .   Per test claim: PA required; PA submitted to [employer]/ [employer] via CoverMyMeds Key/confirmation #/EOC [id number] Status is pending

## 2022-11-27 ENCOUNTER — Other Ambulatory Visit (HOSPITAL_COMMUNITY): Payer: Self-pay

## 2022-11-27 NOTE — Telephone Encounter (Signed)
Pharmacy Patient Advocate Encounter  Received notification from [employer]/ [employer] that Prior Authorization for repatha has been APPROVED from 11/27/22 to 11/27/23 . Ran test claim, Copay is $134.65 for 28 day supply. This test claim was processed through [facility]- copay amounts may vary at other pharmacies due to pharmacy/plan contracts, or as the patient moves through the different stages of their insurance plan.   PA #/Case ID/Reference #: [id number]

## 2022-11-30 ENCOUNTER — Ambulatory Visit: Payer: PPO | Admitting: Family Medicine

## 2022-12-02 ENCOUNTER — Other Ambulatory Visit: Payer: Self-pay | Admitting: Family Medicine

## 2022-12-02 DIAGNOSIS — K219 Gastro-esophageal reflux disease without esophagitis: Secondary | ICD-10-CM

## 2023-01-01 ENCOUNTER — Other Ambulatory Visit: Payer: Self-pay | Admitting: Family Medicine

## 2023-01-01 DIAGNOSIS — E7801 Familial hypercholesterolemia: Secondary | ICD-10-CM

## 2023-01-01 DIAGNOSIS — I2510 Atherosclerotic heart disease of native coronary artery without angina pectoris: Secondary | ICD-10-CM

## 2023-02-14 ENCOUNTER — Ambulatory Visit: Payer: PPO

## 2023-02-14 VITALS — Ht 61.0 in | Wt 122.0 lb

## 2023-02-14 DIAGNOSIS — Z0000 Encounter for general adult medical examination without abnormal findings: Secondary | ICD-10-CM | POA: Diagnosis not present

## 2023-02-14 NOTE — Patient Instructions (Signed)
Ms. Pluim , Thank you for taking time to come for your Medicare Wellness Visit. I appreciate your ongoing commitment to your health goals. Please review the following plan we discussed and let me know if I can assist you in the future.   Referrals/Orders/Follow-Ups/Clinician Recommendations: No  This is a list of the screening recommended for you and due dates:  Health Maintenance  Topic Date Due   Mammogram  05/31/2022   Flu Shot  10/11/2022   COVID-19 Vaccine (4 - 2023-24 season) 11/11/2022   Medicare Annual Wellness Visit  02/14/2024   DEXA scan (bone density measurement)  07/31/2024   Colon Cancer Screening  08/24/2024   DTaP/Tdap/Td vaccine (2 - Td or Tdap) 05/29/2027   Pneumonia Vaccine  Completed   Hepatitis C Screening  Completed   Zoster (Shingles) Vaccine  Completed   HPV Vaccine  Aged Out    Advanced directives: (Copy Requested) Please bring a copy of your health care power of attorney and living will to the office to be added to your chart at your convenience.  Next Medicare Annual Wellness Visit scheduled for next year: No

## 2023-02-14 NOTE — Progress Notes (Signed)
Subjective:   Maria Allison is a 72 y.o. female who presents for Medicare Annual (Subsequent) preventive examination.  Visit Complete: Virtual I connected with  Curley Spice on 02/14/23 by a audio enabled telemedicine application and verified that I am speaking with the correct person using two identifiers.  Patient Location: Home  Provider Location: Home Office  I discussed the limitations of evaluation and management by telemedicine. The patient expressed understanding and agreed to proceed.  Vital Signs: Because this visit was a virtual/telehealth visit, some criteria may be missing or patient reported. Any vitals not documented were not able to be obtained and vitals that have been documented are patient reported.  Cardiac Risk Factors include: advanced age (>18men, >74 women);family history of premature cardiovascular disease;dyslipidemia;hypertension     Objective:    Today's Vitals   02/14/23 1344 02/14/23 1345  Weight: 122 lb (55.3 kg)   Height: 5\' 1"  (1.549 m)   PainSc: 0-No pain 0-No pain   Body mass index is 23.05 kg/m.     02/14/2023    1:46 PM 03/02/2020   10:21 AM  Advanced Directives  Does Patient Have a Medical Advance Directive? Yes Yes  Type of Estate agent of [place];Living will Living will;Healthcare Power of Attorney  Copy of Healthcare Power of Attorney in Chart? No - copy requested No - copy requested    Current Medications (verified) Outpatient Encounter Medications as of 02/14/2023  Medication Sig   amLODipine (NORVASC) 2.5 MG tablet TAKE 1 TABLET(2.5 MG) BY MOUTH DAILY   aspirin 81 MG tablet Take 81 mg by mouth daily.   Calcium Carbonate-Vitamin D (CALCIUM-VITAMIN D3 PO) Take by mouth daily.   Coenzyme Q10 (COQ-10 PO) Take 1 tablet by mouth daily.   EPINEPHrine 0.3 mg/0.3 mL IJ SOAJ injection Use as directed for life-threatening allergic reaction.   Evolocumab (REPATHA SURECLICK) 140 MG/ML SOAJ Inject 140 mg into  the skin every 14 (fourteen) days.   Fish Oil OIL Take 1 tablet by mouth daily.   montelukast (SINGULAIR) 10 MG tablet TAKE 1 TABLET(10 MG) BY MOUTH AT BEDTIME   omeprazole (PRILOSEC) 40 MG capsule TAKE 1 CAPSULE (40 MG TOTAL) BY MOUTH AS NEEDED. TAKE 1 CAPSULE(40 MG) BY MOUTH DAILY   rosuvastatin (CRESTOR) 20 MG tablet Take 1 tablet (20 mg total) by mouth daily.   sertraline (ZOLOFT) 50 MG tablet TAKE 1 TABLET(50 MG) BY MOUTH DAILY   No facility-administered encounter medications on file as of 02/14/2023.    Allergies (verified) Other   History: Past Medical History:  Diagnosis Date   CAD (coronary artery disease), native coronary artery    Coronary CTA shows minimal CAD of pLAD <25% stenosis with Ca score 91 on 9/23   Chest pain    Depression    GERD (gastroesophageal reflux disease)    Hyperlipemia    Osteoporosis    Past Surgical History:  Procedure Laterality Date   ABDOMINAL EXPLORATION SURGERY     BREAST BIOPSY  1997   CARDIAC CATHETERIZATION     LUMBAR DISC SURGERY     TONSILLECTOMY     Family History  Problem Relation Age of Onset   Thrombosis Other        There is a family history of portal vein thrombosis for which family screening has been recommended at [facility]   Leukemia Mother    Heart disease Father    Kidney cancer Father    Heart disease Maternal Grandmother    Ulcerative colitis  Maternal Grandfather    Lung cancer Paternal Grandmother    Liver cancer Paternal Grandmother    Heart disease Brother    Heart attack Brother    Social History   Socioeconomic History   Marital status: Married    Spouse name: Not on file   Number of children: 1   Years of education: Not on file   Highest education level: Some college, no degree  Occupational History   Occupation: Retired  Tobacco Use   Smoking status: Never   Smokeless tobacco: Never  Vaping Use   Vaping status: Never Used  Substance and Sexual Activity   Alcohol use: Yes    Alcohol/week: 1.0  standard drink of alcohol    Types: 1 Glasses of wine per week    Comment: five times per week   Drug use: No   Sexual activity: Yes    Partners: Male    Birth control/protection: None  Other Topics Concern   Not on file  Social History Narrative   The patient lives in [place] with her husband.    She has one daughter who is in college at [employer].    She admits to just occasional alcohol use.  She denies any tobacco    abuse.         Social Determinants of Health   Financial Resource Strain: Low Risk  (02/14/2023)   Overall Financial Resource Strain (CARDIA)    Difficulty of Paying Living Expenses: Not hard at all  Food Insecurity: No Food Insecurity (02/14/2023)   Hunger Vital Sign    Worried About Running Out of Food in the Last Year: Never true    Ran Out of Food in the Last Year: Never true  Transportation Needs: No Transportation Needs (02/14/2023)   PRAPARE - Administrator, Civil Service (Medical): No    Lack of Transportation (Non-Medical): No  Physical Activity: Sufficiently Active (02/14/2023)   Exercise Vital Sign    Days of Exercise per Week: 5 days    Minutes of Exercise per Session: 150+ min  Stress: No Stress Concern Present (02/14/2023)   [employer] of Occupational Health - Occupational Stress Questionnaire    Feeling of Stress : Not at all  Social Connections: Unknown (02/14/2023)   Social Connection and Isolation Panel [NHANES]    Frequency of Communication with Friends and Family: More than three times a week    Frequency of Social Gatherings with Friends and Family: More than three times a week    Attends Religious Services: Not on Marketing executive or Organizations: Yes    Attends Banker Meetings: Not on file    Marital Status: Married    Tobacco Counseling Counseling given: Not Answered   Clinical Intake:  Pre-visit preparation completed: Yes  Pain : No/denies pain Pain Score:  0-No pain     BMI - recorded: 23.05 Nutritional Status: BMI of 19-24  Normal Nutritional Risks: None Diabetes: No  How often do you need to have someone help you when you read instructions, pamphlets, or other written materials from your doctor or pharmacy?: 1 - Never What is the last grade level you completed in school?: 13 YEARS  Interpreter Needed?: No  Information entered by :: Alianis Trimmer N. Bowen Goyal, LPN.   Activities of Daily Living    02/14/2023    1:49 PM 05/30/2022    9:07 AM  In your present state of health, do you have any  difficulty performing the following activities:  Hearing? 0 0  Vision? 0 0  Difficulty concentrating or making decisions? 0 0  Walking or climbing stairs? 0 0  Dressing or bathing? 0 0  Doing errands, shopping? 0 0  Preparing Food and eating ? N   Using the Toilet? N   In the past six months, have you accidently leaked urine? N   Do you have problems with loss of bowel control? N   Managing your Medications? N   Managing your Finances? N   Housekeeping or managing your Housekeeping? N     Patient Care Team: Blane Ohara, MD as PCP - General (Family Medicine) Quintella Reichert, MD as PCP - Cardiology (Cardiology) Earvin Hansen, [facility] (Inactive) as Pharmacist (Pharmacist) Judson Roch, OD as Consulting Physician (Optometry) Mammography, [facility] as Consulting Physician (Diagnostic Radiology)  Indicate any recent Medical Services you may have received from other than [facility] providers in the past year (date may be approximate).     Assessment:   This is a routine wellness examination for [place].  Hearing/Vision screen Hearing Screening - Comments:: Patient denied any hearing difficulty.   No hearing aids.  Vision Screening - Comments:: Patient does not wear any corrective lenses/contacts.   Eye exam done by: [facility]Dr. Jacolyn Reedy    Goals Addressed             This Visit's Progress    Client understands the importance of  follow-up with providers by attending scheduled visits        Depression Screen    02/14/2023    1:48 PM 05/30/2022    9:06 AM 03/12/2022    3:05 AM 03/12/2021   12:12 AM 03/02/2020   12:27 PM 03/02/2020   10:20 AM  PHQ 2/9 Scores  PHQ - 2 Score 0 0 0 0 0 0  PHQ- 9 Score 0         Fall Risk    02/14/2023    1:47 PM 05/30/2022    9:06 AM 03/12/2022    3:04 AM 03/09/2020   11:31 PM 03/02/2020   10:22 AM  Fall Risk   Falls in the past year? 0 1 1  0  Number falls in past yr: 0 0 0  0  Comment  Fell off ladder     Injury with Fall? 0 0 0  0  Risk for fall due to : No Fall Risks No Fall Risks No Fall Risks No Fall Risks Other (Comment)  Follow up Falls prevention discussed Falls evaluation completed Falls evaluation completed;Falls prevention discussed Falls evaluation completed;Falls prevention discussed     MEDICARE RISK AT HOME: Medicare Risk at Home Any stairs in or around the home?: No If so, are there any without handrails?: No Home free of loose throw rugs in walkways, pet beds, electrical cords, etc?: Yes Adequate lighting in your home to reduce risk of falls?: Yes Life alert?: No Use of a cane, walker or w/c?: No Grab bars in the bathroom?: Yes Shower chair or bench in shower?: Yes Elevated toilet seat or a handicapped toilet?: Yes  TIMED UP AND GO:  Was the test performed?  No    Cognitive Function:    02/14/2023    1:49 PM  MMSE - Mini Mental State Exam  Not completed: Unable to complete        02/14/2023    1:48 PM 03/12/2022    3:06 AM 03/02/2020   10:25 AM  6CIT Screen  What Year? 0 points 0 points 0 points  What month? 0 points 0 points 0 points  What time? 0 points 0 points 0 points  Count back from 20 0 points 0 points 0 points  Months in reverse 0 points 0 points 0 points  Repeat phrase 0 points 0 points 0 points  Total Score 0 points 0 points 0 points    Immunizations Immunization History  Administered Date(s) Administered   Fluad Quad(high  Dose 65+) 02/09/2021, 03/08/2022   Hepatitis A 05/28/2017   Hepatitis B 05/28/2017   Influenza-Unspecified 01/12/2020   PFIZER(Purple Top)SARS-COV-2 Vaccination 05/14/2019, 06/09/2019, 12/29/2019   Pneumococcal Conjugate-13 01/03/2017   Pneumococcal Polysaccharide-23 02/13/2018   Tdap 05/28/2017   Zoster Recombinant(Shingrix) 05/21/2020, 10/17/2020    TDAP status: Up to date  Flu Vaccine status: Due, Education has been provided regarding the importance of this vaccine. Advised may receive this vaccine at local pharmacy or Health Dept. Aware to provide a copy of the vaccination record if obtained from local pharmacy or Health Dept. Verbalized acceptance and understanding.  Pneumococcal vaccine status: Up to date  Covid-19 vaccine status: Completed vaccines  Qualifies for Shingles Vaccine? Yes   Zostavax completed No   Shingrix Completed?: Yes  Screening Tests Health Maintenance  Topic Date Due   MAMMOGRAM  05/31/2022   INFLUENZA VACCINE  10/11/2022   COVID-19 Vaccine (4 - 2023-24 season) 11/11/2022   Medicare Annual Wellness (AWV)  02/14/2024   DEXA SCAN  07/31/2024   Colonoscopy  08/24/2024   DTaP/Tdap/Td (2 - Td or Tdap) 05/29/2027   Pneumonia Vaccine 17+ Years old  Completed   Hepatitis C Screening  Completed   Zoster Vaccines- Shingrix  Completed   HPV VACCINES  Aged Out    Health Maintenance  Health Maintenance Due  Topic Date Due   MAMMOGRAM  05/31/2022   INFLUENZA VACCINE  10/11/2022   COVID-19 Vaccine (4 - 2023-24 season) 11/11/2022    Colorectal cancer screening: Type of screening: Colonoscopy. Completed 08/25/2014. Repeat every 10 years  Mammogram status: Completed 06/05/2022. Repeat every year-patient is scheduled for mammogram 06/11/2023 at [facility]  Bone Density status: Completed 08/01/2022. Results reflect: Bone density results: OSTEOPOROSIS. Repeat every 2 years.  Lung Cancer Screening: (Low Dose CT Chest recommended if Age 38-80 years, 20  pack-year currently smoking OR have quit w/in 15years.) does not qualify.   Lung Cancer Screening Referral: no  Additional Screening:  Hepatitis C Screening: does qualify; Completed 05/30/2022  Vision Screening: Recommended annual ophthalmology exams for early detection of glaucoma and other disorders of the eye. Is the patient up to date with their annual eye exam?  Yes  Who is the provider or what is the name of the office in which the patient attends annual eye exams? [facility]Dr. Jacolyn Reedy If pt is not established with a provider, would they like to be referred to a provider to establish care? No .   Dental Screening: Recommended annual dental exams for proper oral hygiene  Diabetic Foot Exam: N/A  Community Resource Referral / Chronic Care Management: CRR required this visit?  No   CCM required this visit?  No     Plan:     I have personally reviewed and noted the following in the patient's chart:   Medical and social history Use of alcohol, tobacco or illicit drugs  Current medications and supplements including opioid prescriptions. Patient is not currently taking opioid prescriptions. Functional ability and status Nutritional status Physical activity Advanced directives List of  other physicians Hospitalizations, surgeries, and ER visits in previous 12 months Vitals Screenings to include cognitive, depression, and falls Referrals and appointments  In addition, I have reviewed and discussed with patient certain preventive protocols, quality metrics, and best practice recommendations. A written personalized care plan for preventive services as well as general preventive health recommendations were provided to patient.     Mickeal Needy, LPN   40/11/8117   After Visit Summary: (MyChart) Due to this being a telephonic visit, the after visit summary with patients personalized plan was offered to patient via MyChart   Nurse Notes: None at this  time.

## 2023-03-24 ENCOUNTER — Other Ambulatory Visit: Payer: Self-pay | Admitting: Cardiology

## 2023-03-24 DIAGNOSIS — R931 Abnormal findings on diagnostic imaging of heart and coronary circulation: Secondary | ICD-10-CM

## 2023-03-24 DIAGNOSIS — I2510 Atherosclerotic heart disease of native coronary artery without angina pectoris: Secondary | ICD-10-CM

## 2023-03-24 DIAGNOSIS — E785 Hyperlipidemia, unspecified: Secondary | ICD-10-CM

## 2023-05-05 ENCOUNTER — Other Ambulatory Visit: Payer: Self-pay | Admitting: Family Medicine

## 2023-05-07 ENCOUNTER — Other Ambulatory Visit: Payer: Self-pay | Admitting: Family Medicine

## 2023-05-07 DIAGNOSIS — I10 Essential (primary) hypertension: Secondary | ICD-10-CM

## 2023-05-07 DIAGNOSIS — K219 Gastro-esophageal reflux disease without esophagitis: Secondary | ICD-10-CM

## 2023-05-22 ENCOUNTER — Other Ambulatory Visit: Payer: PPO

## 2023-05-29 ENCOUNTER — Other Ambulatory Visit (HOSPITAL_BASED_OUTPATIENT_CLINIC_OR_DEPARTMENT_OTHER): Payer: Self-pay | Admitting: Physician Assistant

## 2023-05-29 DIAGNOSIS — I2510 Atherosclerotic heart disease of native coronary artery without angina pectoris: Secondary | ICD-10-CM | POA: Diagnosis not present

## 2023-05-30 LAB — HEPATIC FUNCTION PANEL
ALT: 22 IU/L (ref 0–32)
AST: 26 IU/L (ref 0–40)
Albumin: 4.7 g/dL (ref 3.8–4.8)
Alkaline Phosphatase: 64 IU/L (ref 44–121)
Bilirubin Total: 0.5 mg/dL (ref 0.0–1.2)
Bilirubin, Direct: 0.23 mg/dL (ref 0.00–0.40)
Total Protein: 6.8 g/dL (ref 6.0–8.5)

## 2023-05-31 ENCOUNTER — Telehealth: Payer: Self-pay | Admitting: Cardiology

## 2023-05-31 DIAGNOSIS — I2510 Atherosclerotic heart disease of native coronary artery without angina pectoris: Secondary | ICD-10-CM

## 2023-05-31 NOTE — Telephone Encounter (Signed)
 Pt requesting cb regarding lab test done. Thought she supposed to have a lipid panel done?????

## 2023-05-31 NOTE — Telephone Encounter (Signed)
 Lab orders were in the chart from 10/2022 but had not been released.  Orders released and apologized to pt that both tests were not drawn.  Pt states she will return to [facility] next week for Lipid Panel.  Apologized to pt for her time.  Pt thanked Charity fundraiser for the call.

## 2023-06-02 ENCOUNTER — Encounter: Payer: Self-pay | Admitting: Physician Assistant

## 2023-06-04 DIAGNOSIS — I2510 Atherosclerotic heart disease of native coronary artery without angina pectoris: Secondary | ICD-10-CM | POA: Diagnosis not present

## 2023-06-05 LAB — LIPID PANEL
Chol/HDL Ratio: 1.6 ratio (ref 0.0–4.4)
Cholesterol, Total: 156 mg/dL (ref 100–199)
HDL: 99 mg/dL (ref >39)
LDL Chol Calc (NIH): 43 mg/dL (ref 0–99)
Triglycerides: 71 mg/dL (ref 0–149)
VLDL Cholesterol Cal: 14 mg/dL (ref 5–40)

## 2023-06-06 ENCOUNTER — Encounter: Payer: Self-pay | Admitting: Physician Assistant

## 2023-06-13 DIAGNOSIS — Z1231 Encounter for screening mammogram for malignant neoplasm of breast: Secondary | ICD-10-CM | POA: Diagnosis not present

## 2023-06-13 LAB — HM MAMMOGRAPHY

## 2023-06-14 ENCOUNTER — Encounter: Payer: Self-pay | Admitting: Family Medicine

## 2023-06-24 NOTE — Progress Notes (Unsigned)
 Acute Office Visit  Subjective:    Patient ID: Maria Allison, female    DOB: Jun 13, 1950, 73 y.o.   MRN: [id number]  No chief complaint on file.   Discussed the use of AI scribe software for clinical note transcription with the patient, who gave verbal consent to proceed.       HPI: Patient is in today for ***  Past Medical History:  Diagnosis Date   CAD (coronary artery disease), native coronary artery    Coronary CTA shows minimal CAD of pLAD <25% stenosis with Ca score 91 on 9/23   Chest pain    Depression    GERD (gastroesophageal reflux disease)    Hyperlipemia    Osteoporosis     Past Surgical History:  Procedure Laterality Date   ABDOMINAL EXPLORATION SURGERY     BREAST BIOPSY  1997   CARDIAC CATHETERIZATION     LUMBAR DISC SURGERY     TONSILLECTOMY      Family History  Problem Relation Age of Onset   Thrombosis Other        There is a family history of portal vein thrombosis for which family screening has been recommended at [facility]   Leukemia Mother    Heart disease Father    Kidney cancer Father    Heart disease Maternal Grandmother    Ulcerative colitis Maternal Grandfather    Lung cancer Paternal Grandmother    Liver cancer Paternal Grandmother    Heart disease Brother    Heart attack Brother     Social History   Socioeconomic History   Marital status: Married    Spouse name: Not on file   Number of children: 1   Years of education: Not on file   Highest education level: Some college, no degree  Occupational History   Occupation: Retired  Tobacco Use   Smoking status: Never   Smokeless tobacco: Never  Vaping Use   Vaping status: Never Used  Substance and Sexual Activity   Alcohol use: Yes    Alcohol/week: 1.0 standard drink of alcohol    Types: 1 Glasses of wine per week    Comment: five times per week   Drug use: No   Sexual activity: Yes    Partners: Male    Birth control/protection: None  Other Topics Concern   Not on file   Social History Narrative   The patient lives in [place] with her husband.    She has one daughter who is in college at [employer].    She admits to just occasional alcohol use.  She denies any tobacco    abuse.         Social Drivers of Corporate investment banker Strain: Low Risk  (02/14/2023)   Overall Financial Resource Strain (CARDIA)    Difficulty of Paying Living Expenses: Not hard at all  Food Insecurity: No Food Insecurity (02/14/2023)   Hunger Vital Sign    Worried About Running Out of Food in the Last Year: Never true    Ran Out of Food in the Last Year: Never true  Transportation Needs: No Transportation Needs (02/14/2023)   PRAPARE - Administrator, Civil Service (Medical): No    Lack of Transportation (Non-Medical): No  Physical Activity: Sufficiently Active (02/14/2023)   Exercise Vital Sign    Days of Exercise per Week: 5 days    Minutes of Exercise per Session: 150+ min  Stress: No Stress Concern Present (02/14/2023)  [employer] of Occupational Health - Occupational Stress Questionnaire    Feeling of Stress : Not at all  Social Connections: Unknown (02/14/2023)   Social Connection and Isolation Panel [NHANES]    Frequency of Communication with Friends and Family: More than three times a week    Frequency of Social Gatherings with Friends and Family: More than three times a week    Attends Religious Services: Not on file    Active Member of Clubs or Organizations: Yes    Attends Banker Meetings: Not on file    Marital Status: Married  Intimate Partner Violence: Not At Risk (02/14/2023)   Humiliation, Afraid, Rape, and Kick questionnaire    Fear of Current or Ex-Partner: No    Emotionally Abused: No    Physically Abused: No    Sexually Abused: No    Outpatient Medications Prior to Visit  Medication Sig Dispense Refill   amLODipine (NORVASC) 2.5 MG tablet TAKE 1 TABLET BY MOUTH EVERY DAY 90 tablet 3   aspirin  81 MG tablet Take 81 mg by mouth daily.     Calcium Carbonate-Vitamin D (CALCIUM-VITAMIN D3 PO) Take by mouth daily.     Coenzyme Q10 (COQ-10 PO) Take 1 tablet by mouth daily.     EPINEPHrine 0.3 mg/0.3 mL IJ SOAJ injection Use as directed for life-threatening allergic reaction. 1 each 3   Evolocumab (REPATHA SURECLICK) 140 MG/ML SOAJ ADMINSITER UNDER THE SKIN EVERY 14 DAYS 6 mL 2   Fish Oil OIL Take 1 tablet by mouth daily.     montelukast (SINGULAIR) 10 MG tablet TAKE 1 TABLET (10 MG) BY MOUTH EVERY DAY AT BEDTIME 90 tablet 3   omeprazole (PRILOSEC) 40 MG capsule TAKE 1 CAPSULE (40 MG TOTAL) BY MOUTH AS NEEDED. TAKE 1 CAPSULE(40 MG) BY MOUTH DAILY 90 capsule 1   rosuvastatin (CRESTOR) 20 MG tablet Take 1 tablet (20 mg total) by mouth daily. 90 tablet 3   sertraline (ZOLOFT) 50 MG tablet TAKE 1 TABLET BY MOUTH EVERY DAY 90 tablet 3   No facility-administered medications prior to visit.    Allergies  Allergen Reactions   Other Swelling    Fresh tree fruit and tree nut    Review of Systems     Objective:        02/14/2023    1:44 PM 11/08/2022    3:32 PM 05/30/2022    9:03 AM  Vitals with BMI  Height 5\' 1"  5\' 1"  5\' 1"   Weight 122 lbs 124 lbs 123 lbs  BMI 23.06 23.44 23.25  Systolic  110 118  Diastolic  68 76  Pulse  64     No data found.   Physical Exam  Health Maintenance Due  Topic Date Due   COVID-19 Vaccine (4 - 2024-25 season) 11/11/2022    There are no preventive care reminders to display for this patient.   Lab Results  Component Value Date   TSH 3.080 03/07/2021   Lab Results  Component Value Date   WBC 3.8 05/30/2022   HGB 13.3 05/30/2022   HCT 40.3 05/30/2022   MCV 92 05/30/2022   PLT 226 05/30/2022   Lab Results  Component Value Date   NA 143 05/30/2022   K 4.8 05/30/2022   CO2 23 05/30/2022   GLUCOSE 103 (H) 05/30/2022   BUN 13 05/30/2022   CREATININE 0.74 05/30/2022   BILITOT 0.5 05/29/2023   ALKPHOS 64 05/29/2023   AST 26  05/29/2023  ALT 22 05/29/2023   PROT 6.8 05/29/2023   ALBUMIN 4.7 05/29/2023   CALCIUM 10.0 05/30/2022   EGFR 86 05/30/2022   Lab Results  Component Value Date   CHOL 156 06/04/2023   Lab Results  Component Value Date   HDL 99 06/04/2023   Lab Results  Component Value Date   LDLCALC 43 06/04/2023   Lab Results  Component Value Date   TRIG 71 06/04/2023   Lab Results  Component Value Date   CHOLHDL 1.6 06/04/2023   No results found for: "HGBA1C"     Assessment & Plan:  There are no diagnoses linked to this encounter.   Assessment and Plan       No orders of the defined types were placed in this encounter.   No orders of the defined types were placed in this encounter.    Follow-up: No follow-ups on file.  An After Visit Summary was printed and given to the patient.  Mercy Stall, MD [facility] [phone]

## 2023-06-25 ENCOUNTER — Ambulatory Visit (INDEPENDENT_AMBULATORY_CARE_PROVIDER_SITE_OTHER): Admitting: Family Medicine

## 2023-06-25 ENCOUNTER — Encounter: Payer: Self-pay | Admitting: Family Medicine

## 2023-06-25 VITALS — BP 132/70 | HR 87 | Temp 98.2°F | Ht 61.0 in | Wt 124.0 lb

## 2023-06-25 DIAGNOSIS — L659 Nonscarring hair loss, unspecified: Secondary | ICD-10-CM

## 2023-06-25 DIAGNOSIS — G4700 Insomnia, unspecified: Secondary | ICD-10-CM

## 2023-06-25 DIAGNOSIS — R202 Paresthesia of skin: Secondary | ICD-10-CM

## 2023-06-25 DIAGNOSIS — G4709 Other insomnia: Secondary | ICD-10-CM | POA: Diagnosis not present

## 2023-06-25 DIAGNOSIS — R400 Somnolence: Secondary | ICD-10-CM | POA: Insufficient documentation

## 2023-06-25 DIAGNOSIS — R5383 Other fatigue: Secondary | ICD-10-CM

## 2023-06-25 DIAGNOSIS — M79604 Pain in right leg: Secondary | ICD-10-CM

## 2023-06-25 DIAGNOSIS — M79605 Pain in left leg: Secondary | ICD-10-CM

## 2023-06-25 HISTORY — DX: Somnolence: R40.0

## 2023-06-25 HISTORY — DX: Insomnia, unspecified: G47.00

## 2023-06-25 HISTORY — DX: Pain in right leg: M79.604

## 2023-06-25 HISTORY — DX: Nonscarring hair loss, unspecified: L65.9

## 2023-06-25 HISTORY — DX: Other fatigue: R53.83

## 2023-06-25 HISTORY — DX: Paresthesia of skin: R20.2

## 2023-06-25 NOTE — Assessment & Plan Note (Signed)
 Check labs

## 2023-06-25 NOTE — Assessment & Plan Note (Signed)
 Trial on melatonin 5 mg qhs

## 2023-06-25 NOTE — Assessment & Plan Note (Signed)
 Consider home sleep study.

## 2023-06-25 NOTE — Patient Instructions (Signed)
 Trial on ON MELATONIN 5 MG at bedtime.

## 2023-06-27 ENCOUNTER — Encounter: Payer: Self-pay | Admitting: Family Medicine

## 2023-06-27 LAB — COMPREHENSIVE METABOLIC PANEL WITH GFR
ALT: 22 IU/L (ref 0–32)
AST: 29 IU/L (ref 0–40)
Albumin: 4.6 g/dL (ref 3.8–4.8)
Alkaline Phosphatase: 63 IU/L (ref 44–121)
BUN/Creatinine Ratio: 23 (ref 12–28)
BUN: 16 mg/dL (ref 8–27)
Bilirubin Total: 0.4 mg/dL (ref 0.0–1.2)
CO2: 23 mmol/L (ref 20–29)
Calcium: 9.5 mg/dL (ref 8.7–10.3)
Chloride: 105 mmol/L (ref 96–106)
Creatinine, Ser: 0.69 mg/dL (ref 0.57–1.00)
Globulin, Total: 2.1 g/dL (ref 1.5–4.5)
Glucose: 95 mg/dL (ref 70–99)
Potassium: 5.1 mmol/L (ref 3.5–5.2)
Sodium: 142 mmol/L (ref 134–144)
Total Protein: 6.7 g/dL (ref 6.0–8.5)
eGFR: 92 mL/min/{1.73_m2} (ref >59)

## 2023-06-27 LAB — CBC WITH DIFFERENTIAL/PLATELET
Basophils Absolute: 0.1 10*3/uL (ref 0.0–0.2)
Basos: 1 %
EOS (ABSOLUTE): 0.1 10*3/uL (ref 0.0–0.4)
Eos: 3 %
Hematocrit: 38.5 % (ref 34.0–46.6)
Hemoglobin: 13 g/dL (ref 11.1–15.9)
Immature Grans (Abs): 0 10*3/uL (ref 0.0–0.1)
Immature Granulocytes: 0 %
Lymphocytes Absolute: 1.6 10*3/uL (ref 0.7–3.1)
Lymphs: 40 %
MCH: 31.5 pg (ref 26.6–33.0)
MCHC: 33.8 g/dL (ref 31.5–35.7)
MCV: 93 fL (ref 79–97)
Monocytes Absolute: 0.4 10*3/uL (ref 0.1–0.9)
Monocytes: 11 %
Neutrophils Absolute: 1.8 10*3/uL (ref 1.4–7.0)
Neutrophils: 45 %
Platelets: 235 10*3/uL (ref 150–450)
RBC: 4.13 x10E6/uL (ref 3.77–5.28)
RDW: 12.4 % (ref 11.7–15.4)
WBC: 3.9 10*3/uL (ref 3.4–10.8)

## 2023-06-27 LAB — MAGNESIUM: Magnesium: 2 mg/dL (ref 1.6–2.3)

## 2023-06-27 LAB — B12 AND FOLATE PANEL
Folate: 15.9 ng/mL (ref >3.0)
Vitamin B-12: 687 pg/mL (ref 232–1245)

## 2023-06-27 LAB — CK: Total CK: 101 U/L (ref 32–182)

## 2023-06-27 LAB — FERRITIN: Ferritin: 40 ng/mL (ref 15–150)

## 2023-06-27 LAB — C-REACTIVE PROTEIN: CRP: <1 mg/L (ref 0–10)

## 2023-06-27 LAB — PHOSPHORUS: Phosphorus: 3.6 mg/dL (ref 3.0–4.3)

## 2023-06-27 LAB — SEDIMENTATION RATE: Sed Rate: 2 mm/h (ref 0–40)

## 2023-06-27 LAB — TSH: TSH: 3.39 u[IU]/mL (ref 0.450–4.500)

## 2023-06-27 LAB — METHYLMALONIC ACID, SERUM: Methylmalonic Acid: 226 nmol/L (ref 0–378)

## 2023-07-01 ENCOUNTER — Ambulatory Visit: Payer: PPO | Admitting: Family Medicine

## 2023-07-25 DIAGNOSIS — N952 Postmenopausal atrophic vaginitis: Secondary | ICD-10-CM | POA: Diagnosis not present

## 2023-08-19 ENCOUNTER — Telehealth: Payer: Self-pay | Admitting: Cardiology

## 2023-08-19 NOTE — Telephone Encounter (Signed)
  Pt c/o medication issue:  1. Name of Medication:   rosuvastatin  (CRESTOR ) 20 MG tablet  Evolocumab  (REPATHA  SURECLICK) 140 MG/ML SOAJ   2. How are you currently taking this medication (dosage and times per day)? As written   3. Are you having a reaction (difficulty breathing--STAT)? No   4. What is your medication issue? The patient said that since the beginning of the year, she has been having hip pain that radiates to her ankle. She saw a doctor in March and underwent several tests, all of which came back normal. Her doctor then suggested stopping her cholesterol medication. Before doing so, she wanted to ask Dr. Micael Adas if that is the right thing to do.

## 2023-08-20 NOTE — Telephone Encounter (Signed)
 Would recommend she first stop her rosuvastatin  for a few weeks. If no improvement, then hold Repatha  for an injection.

## 2023-08-20 NOTE — Telephone Encounter (Signed)
 Pt returning call to Demetris   [phone]

## 2023-08-20 NOTE — Telephone Encounter (Signed)
 Left voicemail to return call to office

## 2023-08-20 NOTE — Telephone Encounter (Signed)
 Returned call to pt- reviewed recommendations with patient, patient verbalizes understanding.

## 2023-09-05 ENCOUNTER — Ambulatory Visit: Payer: Self-pay

## 2023-09-05 NOTE — Telephone Encounter (Signed)
  FYI Only or Action Required?: FYI only for provider.  Patient was last seen in primary care on 06/25/2023 by [name], MD. Called Nurse Triage reporting Back Pain. Symptoms began several weeks ago. Interventions attempted: Ice/heat application. Symptoms are: unchanged.  Triage Disposition: See PCP When Office is Open (Within 3 Days)  Patient/caregiver understands and will follow disposition?: Yes                         lower back and leg pain-worsening from last appt         Reason for Disposition  [1] MODERATE back pain (e.g., interferes with normal activities) AND [2] present > 3 days  Answer Assessment - Initial Assessment Questions 1. ONSET: When did the pain begin?      Going on for awhile 2. LOCATION: Where does it hurt? (upper, mid or lower back)     Buttocks to lower back to both legs 3. SEVERITY: How bad is the pain?  (e.g., Scale 1-10; mild, moderate, or severe)   - MILD (1-3): Doesn't interfere with normal activities.    - MODERATE (4-7): Interferes with normal activities or awakens from sleep.    - SEVERE (8-10): Excruciating pain, unable to do any normal activities.      Severe at times 4. PATTERN: Is the pain constant? (e.g., yes, no; constant, intermittent)      Comes and goes 5. RADIATION: Does the pain shoot into your legs or somewhere else?     Down legs  6. CAUSE:  What do you think is causing the back pain?      unknown 7. BACK OVERUSE:  Any recent lifting of heavy objects, strenuous work or exercise?     denies 8. MEDICINES: What have you taken so far for the pain? (e.g., nothing, acetaminophen, NSAIDS)     no 9. NEUROLOGIC SYMPTOMS: Do you have any weakness, numbness, or problems with bowel/bladder control?     no 10. OTHER SYMPTOMS: Do you have any other symptoms? (e.g., fever, abdomen pain, burning with urination, blood in urine)       no 11. PREGNANCY: Is there any chance you are pregnant? When was  your last menstrual period?       na  Protocols used: Back Pain-A-AH

## 2023-09-05 NOTE — Telephone Encounter (Signed)
 Spoke with patient, verbalized understanding and had no questions at this time. Patient states she is headed to the [employer] and wants to just use Aleve till she comes in Monday for her appointment.

## 2023-09-08 NOTE — Progress Notes (Unsigned)
 Acute Office Visit  Subjective:    Patient ID: Maria Allison, female    DOB: 09-26-1950, 73 y.o.   MRN: [id number]  No chief complaint on file.   HPI: Patient is in today for ***  Past Medical History:  Diagnosis Date   CAD (coronary artery disease), native coronary artery    Coronary CTA shows minimal CAD of pLAD <25% stenosis with Ca score 91 on 9/23   Chest pain    Depression    GERD (gastroesophageal reflux disease)    Hyperlipemia    Osteoporosis     Past Surgical History:  Procedure Laterality Date   ABDOMINAL EXPLORATION SURGERY     BREAST BIOPSY  1997   CARDIAC CATHETERIZATION     LUMBAR DISC SURGERY     TONSILLECTOMY      Family History  Problem Relation Age of Onset   Thrombosis Other        There is a family history of portal vein thrombosis for which family screening has been recommended at [facility]   Leukemia Mother    Heart disease Father    Kidney cancer Father    Heart disease Maternal Grandmother    Ulcerative colitis Maternal Grandfather    Lung cancer Paternal Grandmother    Liver cancer Paternal Grandmother    Heart disease Brother    Heart attack Brother     Social History   Socioeconomic History   Marital status: Married    Spouse name: Not on file   Number of children: 1   Years of education: Not on file   Highest education level: Some college, no degree  Occupational History   Occupation: Retired  Tobacco Use   Smoking status: Never   Smokeless tobacco: Never  Vaping Use   Vaping status: Never Used  Substance and Sexual Activity   Alcohol use: Yes    Alcohol/week: 1.0 standard drink of alcohol    Types: 1 Glasses of wine per week    Comment: five times per week   Drug use: No   Sexual activity: Yes    Partners: Male    Birth control/protection: None  Other Topics Concern   Not on file  Social History Narrative   The patient lives in [place] with her husband.    She has one daughter who is in college at [employer].    She admits to just occasional alcohol use.  She denies any tobacco    abuse.         Social Drivers of Corporate investment banker Strain: Low Risk  (02/14/2023)   Overall Financial Resource Strain (CARDIA)    Difficulty of Paying Living Expenses: Not hard at all  Food Insecurity: No Food Insecurity (02/14/2023)   Hunger Vital Sign    Worried About Running Out of Food in the Last Year: Never true    Ran Out of Food in the Last Year: Never true  Transportation Needs: No Transportation Needs (02/14/2023)   PRAPARE - Administrator, Civil Service (Medical): No    Lack of Transportation (Non-Medical): No  Physical Activity: Sufficiently Active (02/14/2023)   Exercise Vital Sign    Days of Exercise per Week: 5 days    Minutes of Exercise per Session: 150+ min  Stress: No Stress Concern Present (02/14/2023)   [employer] of Occupational Health - Occupational Stress Questionnaire    Feeling of Stress : Not at all  Social Connections: Unknown (02/14/2023)  Social Connection and Isolation Panel    Frequency of Communication with Friends and Family: More than three times a week    Frequency of Social Gatherings with Friends and Family: More than three times a week    Attends Religious Services: Not on file    Active Member of Clubs or Organizations: Yes    Attends Banker Meetings: Not on file    Marital Status: Married  Intimate Partner Violence: Not At Risk (02/14/2023)   Humiliation, Afraid, Rape, and Kick questionnaire    Fear of Current or Ex-Partner: No    Emotionally Abused: No    Physically Abused: No    Sexually Abused: No    Outpatient Medications Prior to Visit  Medication Sig Dispense Refill   amLODipine  (NORVASC ) 2.5 MG tablet TAKE 1 TABLET BY MOUTH EVERY DAY 90 tablet 3   aspirin 81 MG tablet Take 81 mg by mouth daily.     Calcium  Carbonate-Vitamin D (CALCIUM -VITAMIN D3 PO) Take by mouth daily.     Coenzyme Q10 (COQ-10  PO) Take 1 tablet by mouth daily.     EPINEPHrine  0.3 mg/0.3 mL IJ SOAJ injection Use as directed for life-threatening allergic reaction. 1 each 3   Evolocumab  (REPATHA  SURECLICK) 140 MG/ML SOAJ ADMINSITER 1ML UNDER THE SKIN EVERY 14 DAYS 6 mL 2   Fish Oil OIL Take 1 tablet by mouth daily.     montelukast  (SINGULAIR ) 10 MG tablet TAKE 1 TABLET (10 MG) BY MOUTH EVERY DAY AT BEDTIME 90 tablet 3   omeprazole  (PRILOSEC) 40 MG capsule TAKE 1 CAPSULE (40 MG TOTAL) BY MOUTH AS NEEDED. TAKE 1 CAPSULE(40 MG) BY MOUTH DAILY 90 capsule 1   rosuvastatin  (CRESTOR ) 20 MG tablet Take 1 tablet (20 mg total) by mouth daily. 90 tablet 3   sertraline  (ZOLOFT ) 50 MG tablet TAKE 1 TABLET BY MOUTH EVERY DAY 90 tablet 3   No facility-administered medications prior to visit.    Allergies  Allergen Reactions   Other Swelling    Fresh tree fruit and tree nut    Review of Systems     Objective:        06/25/2023    8:37 AM 02/14/2023    1:44 PM 11/08/2022    3:32 PM  Vitals with BMI  Height 5' 1 5' 1 5' 1  Weight 124 lbs 122 lbs 124 lbs  BMI 23.44 23.06 23.44  Systolic 132  110  Diastolic 70  68  Pulse 87  64    No data found.   Physical Exam  Health Maintenance Due  Topic Date Due   Hepatitis B Vaccines (2 of 3 - 19+ 3-dose series) 06/25/2017   COVID-19 Vaccine (4 - 2024-25 season) 11/11/2022       Topic Date Due   Hepatitis B Vaccines (2 of 3 - 19+ 3-dose series) 06/25/2017     Lab Results  Component Value Date   TSH 3.390 06/25/2023   Lab Results  Component Value Date   WBC 3.9 06/25/2023   HGB 13.0 06/25/2023   HCT 38.5 06/25/2023   MCV 93 06/25/2023   PLT 235 06/25/2023   Lab Results  Component Value Date   NA 142 06/25/2023   K 5.1 06/25/2023   CO2 23 06/25/2023   GLUCOSE 95 06/25/2023   BUN 16 06/25/2023   CREATININE 0.69 06/25/2023   BILITOT 0.4 06/25/2023   ALKPHOS 63 06/25/2023   AST 29 06/25/2023   ALT 22 06/25/2023   PROT  6.7 06/25/2023   ALBUMIN 4.6  06/25/2023   CALCIUM  9.5 06/25/2023   EGFR 92 06/25/2023   Lab Results  Component Value Date   CHOL 156 06/04/2023   Lab Results  Component Value Date   HDL 99 06/04/2023   Lab Results  Component Value Date   LDLCALC 43 06/04/2023   Lab Results  Component Value Date   TRIG 71 06/04/2023   Lab Results  Component Value Date   CHOLHDL 1.6 06/04/2023   No results found for: HGBA1C     Assessment & Plan:  There are no diagnoses linked to this encounter.   No orders of the defined types were placed in this encounter.   No orders of the defined types were placed in this encounter.    Follow-up: No follow-ups on file.  An After Visit Summary was printed and given to the patient.  Abigail Free, MD [facility] [phone]

## 2023-09-09 ENCOUNTER — Encounter: Payer: Self-pay | Admitting: Family Medicine

## 2023-09-09 ENCOUNTER — Ambulatory Visit (HOSPITAL_BASED_OUTPATIENT_CLINIC_OR_DEPARTMENT_OTHER)
Admission: RE | Admit: 2023-09-09 | Discharge: 2023-09-09 | Disposition: A | Discharge status: Home / Self Care | Source: Ambulatory Visit | Attending: Family Medicine | Admitting: Family Medicine

## 2023-09-09 ENCOUNTER — Ambulatory Visit (INDEPENDENT_AMBULATORY_CARE_PROVIDER_SITE_OTHER): Admitting: Family Medicine

## 2023-09-09 VITALS — BP 118/70 | HR 68 | Temp 98.2°F | Ht 61.0 in | Wt 119.0 lb

## 2023-09-09 DIAGNOSIS — M545 Low back pain, unspecified: Secondary | ICD-10-CM | POA: Insufficient documentation

## 2023-09-09 DIAGNOSIS — M47816 Spondylosis without myelopathy or radiculopathy, lumbar region: Secondary | ICD-10-CM | POA: Diagnosis not present

## 2023-09-09 DIAGNOSIS — M48061 Spinal stenosis, lumbar region without neurogenic claudication: Secondary | ICD-10-CM | POA: Diagnosis not present

## 2023-09-09 HISTORY — DX: Low back pain, unspecified: M54.50

## 2023-09-09 MED ORDER — NAPROXEN 500 MG PO TABS
500.0000 mg | ORAL_TABLET | Freq: Two times a day (BID) | ORAL | 0 refills | Status: DC
Start: 2023-09-09 — End: 2023-10-06

## 2023-09-09 MED ORDER — PREDNISONE 50 MG PO TABS: 50.0000 mg | ORAL_TABLET | Freq: Every day | ORAL | 0 refills | Status: DC

## 2023-09-09 NOTE — Assessment & Plan Note (Signed)
 Naproxen otc 2 twice a day until returns from vacation and then prednisone  50 mg daily x 5 days.  Refer physical therapy.  Exercises.  Tylenol.   [facility] For lumbar xray [address] [place], [place] [address] [phone]  IMAGING 8-5 PM MONDAY THROUGH FRIDAY.

## 2023-09-09 NOTE — Patient Instructions (Signed)
 Naproxen otc 2 twice a day until returns from vacation and then prednisone  50 mg daily x 5 days.  Refer physical therapy.  Exercises.  Tylenol.   [facility] For [facility] xray [address], [place], [place] [address] [phone]  IMAGING 8-5 PM MONDAY THROUGH FRIDAY.

## 2023-09-16 ENCOUNTER — Ambulatory Visit: Payer: Self-pay | Admitting: Family Medicine

## 2023-09-19 DIAGNOSIS — M545 Low back pain, unspecified: Secondary | ICD-10-CM | POA: Diagnosis not present

## 2023-09-20 ENCOUNTER — Telehealth: Payer: Self-pay | Admitting: Family Medicine

## 2023-09-20 NOTE — Telephone Encounter (Signed)
 [facility] [place]-PLAN OF CARE-PUT IN DR. [facility] BOX TO SIGN

## 2023-09-23 DIAGNOSIS — M545 Low back pain, unspecified: Secondary | ICD-10-CM | POA: Diagnosis not present

## 2023-09-25 DIAGNOSIS — M545 Low back pain, unspecified: Secondary | ICD-10-CM | POA: Diagnosis not present

## 2023-10-01 DIAGNOSIS — M545 Low back pain, unspecified: Secondary | ICD-10-CM | POA: Diagnosis not present

## 2023-10-06 ENCOUNTER — Other Ambulatory Visit: Payer: Self-pay | Admitting: Family Medicine

## 2023-10-06 DIAGNOSIS — M545 Low back pain, unspecified: Secondary | ICD-10-CM

## 2023-10-07 ENCOUNTER — Ambulatory Visit: Admitting: Family Medicine

## 2023-10-14 DIAGNOSIS — M545 Low back pain, unspecified: Secondary | ICD-10-CM | POA: Diagnosis not present

## 2023-10-22 ENCOUNTER — Ambulatory Visit: Admitting: Family Medicine

## 2023-10-25 ENCOUNTER — Encounter: Payer: Self-pay | Admitting: Family Medicine

## 2023-10-25 ENCOUNTER — Ambulatory Visit (INDEPENDENT_AMBULATORY_CARE_PROVIDER_SITE_OTHER): Admitting: Family Medicine

## 2023-10-25 VITALS — BP 122/78 | HR 66 | Temp 98.0°F | Resp 16 | Ht 61.0 in | Wt 121.0 lb

## 2023-10-25 DIAGNOSIS — R202 Paresthesia of skin: Secondary | ICD-10-CM | POA: Diagnosis not present

## 2023-10-25 DIAGNOSIS — M545 Low back pain, unspecified: Secondary | ICD-10-CM

## 2023-10-25 NOTE — Assessment & Plan Note (Signed)
 Resolved - she reports significant improvement after prednisone  course.

## 2023-10-25 NOTE — Assessment & Plan Note (Signed)
 Chronic low back pain Pain manageable with current treatment. Numbness resolved, likely due to reduced inflammation from prednisone . - Continue home exercises. - Use naproxen  as needed. - No prednisone  refill needed

## 2023-10-25 NOTE — Progress Notes (Signed)
 Subjective:  Patient ID: Maria Allison, female    DOB: [birth year]  Age: 73 y.o. MRN: [id number]  Chief Complaint  Patient presents with   Follow-up    Six week    Discussed the use of AI scribe software for clinical note transcription with the patient, who gave verbal consent to proceed.  History of Present Illness   Maria Allison is a 73 year old female who presents for a six-week follow-up for back pain.  She has been experiencing back pain, previously managed with prednisone  and physical therapy. Prednisone  effectively reduced inflammation, allowing her to engage in physical therapy. She continues the exercises at home, feeling she no longer needs formal therapy sessions, and has the necessary equipment, such as an exercise ball, to perform the exercises independently.  She currently takes naproxen  as needed and reports infrequent use. She engages in yard work and other activities without significant pain, although some pain persists. The numbness previously experienced down the outside of her legs has resolved.  In addition to back pain, she has a history of wrist pain, which has improved. She humorously refers to her arthritis as '[place]' and notes it is currently in remission.  Wrist pain has improved.          09/09/2023    9:36 AM 06/25/2023    8:39 AM 02/14/2023    1:48 PM 05/30/2022    9:06 AM 03/12/2022    3:05 AM  Depression screen PHQ 2/9  Decreased Interest 0 0 0 0 0  Down, Depressed, Hopeless 0 0 0 0 0  PHQ - 2 Score 0 0 0 0 0  Altered sleeping 2 0 0    Tired, decreased energy 1 0 0    Change in appetite 0 0 0    Feeling bad or failure about yourself  0 0 0    Trouble concentrating 1 0 0    Moving slowly or fidgety/restless 0 0 0    Suicidal thoughts 0 0 0    PHQ-9 Score 4 0 0    Difficult doing work/chores Somewhat difficult Not difficult at all Not difficult at all          09/09/2023    9:36 AM  Fall Risk   Falls in the past year? 0  Number falls  in past yr: 0  Injury with Fall? 0  Risk for fall due to : No Fall Risks  Follow up Falls evaluation completed    Patient Care Team: [name], MD as PCP - General (Family Medicine) Shlomo Wilbert SAUNDERS, MD as PCP - Cardiology (Cardiology) Delores Lauraine NOVAK, [facility] (Inactive) as Pharmacist (Pharmacist) Cleotilde Norleen BIRCH, OD as Consulting Physician (Optometry) Mammography, [facility] as Consulting Physician (Diagnostic Radiology)   Review of Systems  Constitutional:  Negative for chills, diaphoresis, fatigue and fever.  HENT:  Negative for congestion, ear pain and sinus pain.   Eyes: Negative.   Respiratory:  Negative for cough and shortness of breath.   Cardiovascular:  Negative for chest pain.  Gastrointestinal:  Negative for abdominal pain, constipation, diarrhea, nausea and vomiting.  Endocrine: Negative.   Genitourinary:  Negative for dysuria, frequency and urgency.  Musculoskeletal:  Positive for back pain (improved). Negative for arthralgias.  Allergic/Immunologic: Negative.   Neurological:  Negative for dizziness, weakness, light-headedness and headaches.  Hematological: Negative.   Psychiatric/Behavioral:  Negative for dysphoric mood. The patient is not nervous/anxious.     Current Outpatient Medications on File Prior to Visit  Medication  Sig Dispense Refill   amLODipine  (NORVASC ) 2.5 MG tablet TAKE 1 TABLET BY MOUTH EVERY DAY 90 tablet 3   aspirin 81 MG tablet Take 81 mg by mouth daily.     Calcium  Carbonate-Vitamin D (CALCIUM -VITAMIN D3 PO) Take by mouth daily.     Coenzyme Q10 (COQ-10 PO) Take 1 tablet by mouth daily.     EPINEPHrine  0.3 mg/0.3 mL IJ SOAJ injection Use as directed for life-threatening allergic reaction. 1 each 3   Evolocumab  (REPATHA  SURECLICK) 140 MG/ML SOAJ ADMINSITER UNDER THE SKIN EVERY 14 DAYS 6 mL 2   Fish Oil OIL Take 1 tablet by mouth daily.     montelukast  (SINGULAIR ) 10 MG tablet TAKE 1 TABLET (10 MG) BY MOUTH EVERY DAY AT BEDTIME 90 tablet 3    naproxen  (NAPROSYN ) 500 MG tablet TAKE 1 TABLET BY MOUTH 2 TIMES DAILY WITH A MEAL. 60 tablet 0   omeprazole  (PRILOSEC) 40 MG capsule TAKE 1 CAPSULE (40 MG TOTAL) BY MOUTH AS NEEDED. TAKE 1 CAPSULE(40 MG) BY MOUTH DAILY 90 capsule 1   Probiotic Product (PROBIOTIC DAILY PO) Take 1 Each/kg by mouth daily.     rosuvastatin  (CRESTOR ) 20 MG tablet Take 1 tablet (20 mg total) by mouth daily. 90 tablet 3   sertraline  (ZOLOFT ) 50 MG tablet TAKE 1 TABLET BY MOUTH EVERY DAY 90 tablet 3   No current facility-administered medications on file prior to visit.   Past Medical History:  Diagnosis Date   CAD (coronary artery disease), native coronary artery    Coronary CTA shows minimal CAD of pLAD <25% stenosis with Ca score 91 on 9/23   Chest pain    Depression    GERD (gastroesophageal reflux disease)    Hyperlipemia    Hypertension 12/22   Osteoporosis    Past Surgical History:  Procedure Laterality Date   ABDOMINAL EXPLORATION SURGERY     BREAST BIOPSY  1997   CARDIAC CATHETERIZATION  2010   No heart problem   LUMBAR DISC SURGERY     TONSILLECTOMY      Family History  Problem Relation Age of Onset   Thrombosis Other        There is a family history of portal vein thrombosis for which family screening has been recommended at [facility]   Leukemia Mother    Anemia Mother        Congestive heart disease   Clotting disorder Mother        Died @ 6 with blood clots, & leukemia   Hypertension Mother    Heart disease Father        Heart & lung disease   Kidney cancer Father    Hypertension Father    Heart disease Maternal Grandmother        Passed @ 57 Heart attack @ 59   Ulcerative colitis Maternal Grandfather    Lung cancer Paternal Grandmother    Liver cancer Paternal Grandmother    Heart disease Paternal Grandfather        Died @ 19. Heart attack   Heart disease Brother        Bypass surgery @ 46   Heart attack Brother        Passed on @ 69 w/ massive heart attack   Heart disease  Maternal Aunt        A fib   Heart disease Paternal Uncle        Heart attack @ 47   Social History   Socioeconomic History  Marital status: Married    Spouse name: Not on file   Number of children: 1   Years of education: Not on file   Highest education level: Some college, no degree  Occupational History   Occupation: Retired  Tobacco Use   Smoking status: Never   Smokeless tobacco: Never  Vaping Use   Vaping status: Never Used  Substance and Sexual Activity   Alcohol use: Yes    Alcohol/week: 1.0 standard drink of alcohol    Types: 1 Glasses of wine per week    Comment: five times per week   Drug use: No   Sexual activity: Yes    Partners: Male    Birth control/protection: None  Other Topics Concern   Not on file  Social History Narrative   The patient lives in [place] with her husband.    She has one daughter who is in college at [employer].    She admits to just occasional alcohol use.  She denies any tobacco    abuse.         Social Drivers of Corporate investment banker Strain: Low Risk  (02/14/2023)   Overall Financial Resource Strain (CARDIA)    Difficulty of Paying Living Expenses: Not hard at all  Food Insecurity: No Food Insecurity (02/14/2023)   Hunger Vital Sign    Worried About Running Out of Food in the Last Year: Never true    Ran Out of Food in the Last Year: Never true  Transportation Needs: No Transportation Needs (02/14/2023)   PRAPARE - Administrator, Civil Service (Medical): No    Lack of Transportation (Non-Medical): No  Physical Activity: Sufficiently Active (02/14/2023)   Exercise Vital Sign    Days of Exercise per Week: 5 days    Minutes of Exercise per Session: 150+ min  Stress: No Stress Concern Present (02/14/2023)   [employer] of Occupational Health - Occupational Stress Questionnaire    Feeling of Stress : Not at all  Social Connections: Unknown (02/14/2023)   Social Connection and Isolation  Panel    Frequency of Communication with Friends and Family: More than three times a week    Frequency of Social Gatherings with Friends and Family: More than three times a week    Attends Religious Services: Not on Insurance claims handler of Clubs or Organizations: Yes    Attends Banker Meetings: Not on file    Marital Status: Married    Objective:  BP 122/78   Pulse 66   Temp 98 F (36.7 C) (Temporal)   Resp 16   Ht 5' 1 (1.549 m)   Wt 121 lb (54.9 kg)   SpO2 97%   BMI 22.86 kg/m      10/25/2023    8:57 AM 09/09/2023    9:35 AM 06/25/2023    8:37 AM  BP/Weight  Systolic BP 122 118 132  Diastolic BP 78 70 70  Wt. (Lbs) 121 119 124  BMI 22.86 kg/m2 22.48 kg/m2 23.43 kg/m2    Physical Exam Vitals reviewed.  Constitutional:      General: She is not in acute distress.    Appearance: Normal appearance. She is not ill-appearing.  Eyes:     Conjunctiva/sclera: Conjunctivae normal.  Cardiovascular:     Rate and Rhythm: Normal rate and regular rhythm.     Heart sounds: Normal heart sounds. No murmur heard. Pulmonary:     Effort: Pulmonary effort is normal.  Breath sounds: Normal breath sounds. No wheezing.  Abdominal:     General: Bowel sounds are normal.     Palpations: Abdomen is soft.     Tenderness: There is no abdominal tenderness.  Skin:    General: Skin is warm.  Neurological:     Mental Status: She is alert. Mental status is at baseline.  Psychiatric:        Mood and Affect: Mood normal.        Behavior: Behavior normal.     Lab Results  Component Value Date   WBC 3.9 06/25/2023   HGB 13.0 06/25/2023   HCT 38.5 06/25/2023   PLT 235 06/25/2023   GLUCOSE 95 06/25/2023   CHOL 156 06/04/2023   TRIG 71 06/04/2023   HDL 99 06/04/2023   LDLCALC 43 06/04/2023   ALT 22 06/25/2023   AST 29 06/25/2023   NA 142 06/25/2023   K 5.1 06/25/2023   CL 105 06/25/2023   CREATININE 0.69 06/25/2023   BUN 16 06/25/2023   CO2 23 06/25/2023   TSH  3.390 06/25/2023      Assessment & Plan:  Lumbar back pain Assessment & Plan: Chronic low back pain Pain manageable with current treatment. Numbness resolved, likely due to reduced inflammation from prednisone . - Continue home exercises. - Use naproxen  as needed. - No prednisone  refill needed   Paresthesia of bilateral legs Assessment & Plan: Resolved - she reports significant improvement after prednisone  course.      Follow-up: Return if symptoms worsen or fail to improve.   I,Angela Taylor,acting as a Neurosurgeon for Harrie CHRISTELLA Cedar, FNP.,have documented all relevant documentation on the behalf of Harrie CHRISTELLA Cedar, FNP,as directed by  Harrie CHRISTELLA Cedar, FNP while in the presence of Harrie CHRISTELLA Cedar, FNP.   An After Visit Summary was printed and given to the patient.  I attest that I have reviewed this visit and agree with the plan scribed by my staff.   Harrie CHRISTELLA Cedar, FNP [facility] [phone]

## 2023-11-03 ENCOUNTER — Other Ambulatory Visit: Payer: Self-pay | Admitting: Physician Assistant

## 2023-11-03 ENCOUNTER — Other Ambulatory Visit: Payer: Self-pay | Admitting: Family Medicine

## 2023-11-03 DIAGNOSIS — K219 Gastro-esophageal reflux disease without esophagitis: Secondary | ICD-10-CM

## 2023-11-06 DIAGNOSIS — H43813 Vitreous degeneration, bilateral: Secondary | ICD-10-CM | POA: Diagnosis not present

## 2023-12-02 DIAGNOSIS — L814 Other melanin hyperpigmentation: Secondary | ICD-10-CM | POA: Diagnosis not present

## 2023-12-02 DIAGNOSIS — L578 Other skin changes due to chronic exposure to nonionizing radiation: Secondary | ICD-10-CM | POA: Diagnosis not present

## 2023-12-02 DIAGNOSIS — D225 Melanocytic nevi of trunk: Secondary | ICD-10-CM | POA: Diagnosis not present

## 2023-12-02 DIAGNOSIS — L821 Other seborrheic keratosis: Secondary | ICD-10-CM | POA: Diagnosis not present

## 2023-12-17 ENCOUNTER — Telehealth: Payer: Self-pay | Admitting: Pharmacy Technician

## 2023-12-17 ENCOUNTER — Other Ambulatory Visit: Payer: Self-pay | Admitting: Cardiology

## 2023-12-17 DIAGNOSIS — I2510 Atherosclerotic heart disease of native coronary artery without angina pectoris: Secondary | ICD-10-CM

## 2023-12-17 DIAGNOSIS — E782 Mixed hyperlipidemia: Secondary | ICD-10-CM

## 2023-12-17 DIAGNOSIS — R931 Abnormal findings on diagnostic imaging of heart and coronary circulation: Secondary | ICD-10-CM

## 2023-12-17 DIAGNOSIS — E785 Hyperlipidemia, unspecified: Secondary | ICD-10-CM

## 2023-12-17 NOTE — Telephone Encounter (Signed)
 Hi, insurance is asking for labs done within the last 120 labs. The last labs found were from 06/04/23. Can she get updated lipid labs for this prior authorization? Thank you

## 2023-12-18 NOTE — Telephone Encounter (Signed)
 Spoke with patient regarding need for updated lipid panel for [place]  prior auth.  Patient states she would like to be seen at [facility] office as it is closer for her.  Patient requesting provider switch from Dr. Shlomo to Dr. Monetta.

## 2023-12-19 ENCOUNTER — Ambulatory Visit: Admitting: Cardiovascular Disease

## 2023-12-23 DIAGNOSIS — R079 Chest pain, unspecified: Secondary | ICD-10-CM | POA: Insufficient documentation

## 2023-12-23 DIAGNOSIS — I10 Essential (primary) hypertension: Secondary | ICD-10-CM | POA: Insufficient documentation

## 2023-12-23 NOTE — Telephone Encounter (Signed)
 Health team advantage called in needs additional clinical info  Diagnose Name  LDL basic line  If the member is currently receiving statin therapy   Best number [phone] Option 2

## 2023-12-24 ENCOUNTER — Ambulatory Visit

## 2023-12-24 VITALS — BP 120/80 | HR 57 | Ht 61.0 in | Wt 121.6 lb

## 2023-12-24 DIAGNOSIS — I2510 Atherosclerotic heart disease of native coronary artery without angina pectoris: Secondary | ICD-10-CM

## 2023-12-24 DIAGNOSIS — I10 Essential (primary) hypertension: Secondary | ICD-10-CM

## 2023-12-24 DIAGNOSIS — E782 Mixed hyperlipidemia: Secondary | ICD-10-CM | POA: Diagnosis not present

## 2023-12-24 NOTE — Patient Instructions (Signed)
 Medication Instructions:  Your physician recommends that you continue on your current medications as directed. Please refer to the Current Medication list given to you today.  *If you need a refill on your cardiac medications before your next appointment, please call your pharmacy*   Lab Work: Your physician recommends that you have a CMP and lipids today in the office.  If you have labs (blood work) drawn today and your tests are completely normal, you will receive your results only by: MyChart Message (if you have MyChart) OR A paper copy in the mail If you have any lab test that is abnormal or we need to change your treatment, we will call you to review the results.   Testing/Procedures: None ordered   Follow-Up: At [facility], you and your health needs are our priority.  As part of our continuing mission to provide you with exceptional heart care, we have created designated Provider Care Teams.  These Care Teams include your primary Cardiologist (physician) and Advanced Practice Providers (APPs -  Physician Assistants and Nurse Practitioners) who all work together to provide you with the care you need, when you need it.  We recommend signing up for the patient portal called MyChart.  Sign up information is provided on this After Visit Summary.  MyChart is used to connect with patients for Virtual Visits (Telemedicine).  Patients are able to view lab/test results, encounter notes, upcoming appointments, etc.  Non-urgent messages can be sent to your provider as well.   To learn more about what you can do with MyChart, go to [email or web address].    Your next appointment:   12 month(s)  The format for your next appointment:   In Person  Provider:   Alean Kobus, MD    Other Instructions none  Important Information About Sugar

## 2023-12-24 NOTE — Progress Notes (Signed)
 Cardiology Consultation:    Date:  12/24/2023   ID:  Maria Allison, DOB 04-21-1950, MRN [id number]  PCP:  [name], MD  Cardiologist:  Alean SAUNDERS Erion Weightman, MD   Referring MD: [name], MD   No chief complaint on file.    ASSESSMENT AND PLAN:   [name] 73 year old woman with history of mild nonobstructive CAD [normal cath in 2010; cardiac CT September 2023 less than 25% stenosis proximal LAD CAD RADS 1 study, calcium  score 91], hyperlipidemia, echocardiogram September 2023 LVEF 50 to 55%, trace MR, hyperlipidemia, hypertension, depression, GERD. Calcium  score was 0 July 2013 Calcium  score in November 2017 was 53 Calcium  score September 2023 was 91 [71st percentile] No significant carotid artery stenosis less than 39% bilateral on ultrasound July 2013  Here to establish care.  Problem List Items Addressed This Visit     Hyperlipemia - Primary   Proceed with fasting lipid panel today. Last lipid panel March 2025 optimal control. Continue with rosuvastatin  20 mg once daily Continue with Repatha  140 mg subcutaneous injection, prescribed once every couple weeks but she has been taking it once every 3 weeks to help with her cost concerns.  She was wondering about once every 6 months to once every 55-month injection for lipid control, we discussed Leqvio. If she is further interested I can place a referral to see clinical pharmacist here in the office to further discuss this and assess her for eligibility.      Relevant Orders   EKG 12-Lead (Completed)   Comprehensive metabolic panel with GFR   Lipid panel   CAD (coronary artery disease), native coronary artery   Mild nonobstructive disease Normal cath in 2010 Calcium  score was 0 July 2013 Calcium  score in November 2017 was 53 Calcium  score September 2023 was 91 [71st percentile] Cardiac CT coronary angiogram September 2023 with CAD RADS 1 study less than 25% stenosis of proximal LAD.  Asymptomatic Good  functional status. Continue with aspirin 81 mg once daily Continue lipid management as discussed above.       Essential hypertension, benign   Well-controlled. Continue amlodipine  2.5 mg once daily.        Return to clinic tentatively in 1 year.  History of Present Illness:    Maria Allison is a 73 y.o. female who is being seen today for follow-up visit. PCP is Cox, Clapper, MD. Last visit with cardiology office was 11/08/2022 in [place] with Orren Fabry, PA-C.  Previously followed up with Dr. Shlomo and other cardiology providers with heart care.Maria Allison  Has history of mild nonobstructive CAD [normal cath in 2010; cardiac CT September 2023 less than 25% stenosis proximal LAD CAD RADS 1 study, calcium  score 91], hyperlipidemia, echocardiogram September 2023 LVEF 50 to 55%, trace MR, hyperlipidemia, hypertension, depression, GERD. Calcium  score was 0 July 2013 Calcium  score in November 2017 was 53 Calcium  score September 2023 was 91 [71st percentile] No significant carotid artery stenosis less than 39% bilateral on ultrasound July 2013  Pleasant woman here for the visit by herself.  Lives with her husband at home.  Retired.  Keeps herself busy with day-to-day activities at home and gardening.  Denies any cardiac symptoms. Has chronic intermittent neck pain which appears musculoskeletal in description based on her episodes while working in the kitchen. No chest pain, shortness of breath, orthopnea paroxysmal nocturnal dyspnea. No palpitations, pedal edema.  No dizziness.  No syncopal episodes.  Takes her medications and Repatha  has been well-tolerated. Due to  insurance concerns and costs, she has been taking the Repatha  injection once every 3 weeks rather than 2 weeks to reduce the cost.  Mentions blood pressures at home well-controlled.  EKG in the clinic today shows sinus rhythm heart rate 57/min, PR interval 138 ms, QRS duration 78 ms, QTc 412 ms.  No ischemia.  Recent lipid  panel reviewed from 06/04/2023 with total Vaslow 156, triglycerides 71, HDL 99, LDL 43.   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   Encounter for hepatitis C screening test for low risk patient 05/30/2022   Essential hypertension, benign 06/03/2022   GERD (gastroesophageal reflux disease)    Hair loss 06/25/2023   Hyperlipemia    Hypertension 12/22   Insomnia 06/25/2023   Lumbar back pain 09/09/2023   Menopausal symptom 06/03/2022   Osteoporosis    Osteoporosis screening 05/30/2022   Other fatigue 06/25/2023   Pain in both lower extremities 06/25/2023   Paresthesia of bilateral legs 06/25/2023    Past Surgical History:  Procedure Laterality Date   ABDOMINAL EXPLORATION SURGERY     BREAST BIOPSY  1997   CARDIAC CATHETERIZATION  2010   No heart problem   LUMBAR DISC SURGERY     TONSILLECTOMY      Current Medications: Current Meds  Medication Sig   amLODipine  (NORVASC ) 2.5 MG tablet TAKE 1 TABLET BY MOUTH EVERY DAY   aspirin 81 MG tablet Take 81 mg by mouth daily.   Calcium  Carbonate-Vitamin D (CALCIUM -VITAMIN D3 PO) Take by mouth daily.   Coenzyme Q10 (COQ-10 PO) Take 1 tablet by mouth daily.   EPINEPHrine  0.3 mg/0.3 mL IJ SOAJ injection Use as directed for life-threatening allergic reaction.   estradiol (ESTRACE) 0.1 MG/GM vaginal cream Place 1 Applicatorful vaginally daily.   Evolocumab  (REPATHA  SURECLICK) 140 MG/ML SOAJ ADMINSITER UNDER THE SKIN EVERY 14 DAYS   Fish Oil OIL Take 1 tablet by mouth daily.   montelukast  (SINGULAIR ) 10 MG tablet TAKE 1 TABLET (10 MG) BY MOUTH EVERY DAY AT BEDTIME   naproxen  (NAPROSYN ) 500 MG tablet TAKE 1 TABLET BY MOUTH 2 TIMES DAILY WITH A MEAL.    omeprazole  (PRILOSEC) 40 MG capsule TAKE 1 CAPSULE (40 MG TOTAL) BY MOUTH AS NEEDED. TAKE 1 CAPSULE(40 MG) BY MOUTH DAILY   Probiotic Product (PROBIOTIC DAILY PO) Take 1 Each/kg by mouth daily.   rosuvastatin  (CRESTOR ) 20 MG tablet Take 1 tablet (20 mg total) by mouth daily.   sertraline  (ZOLOFT ) 50 MG tablet TAKE 1 TABLET BY MOUTH EVERY DAY     Allergies:   Other   Social History   Socioeconomic History   Marital status: Married    Spouse name: Not on file   Number of children: 1   Years of education: Not on file   Highest education level: Some college, no degree  Occupational History   Occupation: Retired  Tobacco Use   Smoking status: Never   Smokeless tobacco: Never  Vaping Use   Vaping status: Never Used  Substance and Sexual Activity   Alcohol use: Yes    Alcohol/week: 1.0 standard drink of alcohol    Types: 1 Glasses of wine per week    Comment: five  times per week   Drug use: No   Sexual activity: Yes    Partners: Male    Birth control/protection: None  Other Topics Concern   Not on file  Social History Narrative   The patient lives in [place] with her husband.    She has one daughter who is in college at [employer].    She admits to just occasional alcohol use.  She denies any tobacco    abuse.         Social Drivers of Corporate investment banker Strain: Low Risk  (02/14/2023)   Overall Financial Resource Strain (CARDIA)    Difficulty of Paying Living Expenses: Not hard at all  Food Insecurity: No Food Insecurity (02/14/2023)   Hunger Vital Sign    Worried About Running Out of Food in the Last Year: Never true    Ran Out of Food in the Last Year: Never true  Transportation Needs: No Transportation Needs (02/14/2023)   PRAPARE - Administrator, Civil Service (Medical): No    Lack of Transportation (Non-Medical): No  Physical Activity: Sufficiently Active (02/14/2023)   Exercise Vital Sign    Days of Exercise per Week: 5  days    Minutes of Exercise per Session: 150+ min  Stress: No Stress Concern Present (02/14/2023)   [employer] of Occupational Health - Occupational Stress Questionnaire    Feeling of Stress : Not at all  Social Connections: Unknown (02/14/2023)   Social Connection and Isolation Panel    Frequency of Communication with Friends and Family: More than three times a week    Frequency of Social Gatherings with Friends and Family: More than three times a week    Attends Religious Services: Not on Marketing executive or Organizations: Yes    Attends Banker Meetings: Not on file    Marital Status: Married     Family History: The patient's family history includes Anemia in her mother; Clotting disorder in her mother; Heart attack in her brother; Heart disease in her brother, father, maternal aunt, maternal grandmother, paternal grandfather, and paternal uncle; Hypertension in her father and mother; Kidney cancer in her father; Leukemia in her mother; Liver cancer in her paternal grandmother; Lung cancer in her paternal grandmother; Thrombosis in an other family member; Ulcerative colitis in her maternal grandfather. ROS:   Please see the history of present illness.    All 14 point review of systems negative except as described per history of present illness.  EKGs/Labs/Other Studies Reviewed:    The following studies were reviewed today:   EKG:  EKG Interpretation Date/Time:  Tuesday December 24 2023 09:32:37 EDT Ventricular Rate:  57 PR Interval:  138 QRS Duration:  78 QT Interval:  424 QTC Calculation: 412 R Axis:   56  Text Interpretation: Sinus bradycardia Low voltage QRS When compared with ECG of 08-Nov-2022 15:31, Questionable change in QRS axis Confirmed by Liborio Hai reddy [phone]) on 12/24/2023 9:43:28 AM    Recent Labs: 06/25/2023: ALT 22; BUN 16; Creatinine, Ser 0.69; Hemoglobin 13.0; Magnesium 2.0; Platelets 235; Potassium 5.1; Sodium 142;  TSH 3.390  Recent Lipid Panel    Component Value Date/Time   CHOL 156 06/04/2023 1046   TRIG 71 06/04/2023 1046   HDL 99 06/04/2023 1046   CHOLHDL 1.6 06/04/2023 1046   CHOLHDL 2.5 02/15/2016 0904   VLDL 12 02/15/2016 0904   LDLCALC 43 06/04/2023 1046    Physical  Exam:    VS:  BP 120/80   Pulse (!) 57   Ht 5' 1 (1.549 m)   Wt 121 lb 9.6 oz (55.2 kg)   SpO2 99%   BMI 22.98 kg/m     Wt Readings from Last 3 Encounters:  12/24/23 121 lb 9.6 oz (55.2 kg)  10/25/23 121 lb (54.9 kg)  09/09/23 119 lb (54 kg)     GENERAL:  Well nourished, well developed in no acute distress NECK: No JVD; No carotid bruits CARDIAC: RRR, S1 and S2 present, no murmurs, no rubs, no gallops CHEST:  Clear to auscultation without rales, wheezing or rhonchi  Extremities: No pitting pedal edema. Pulses bilaterally symmetric with radial 2+ and dorsalis pedis 2+ NEUROLOGIC:  Alert and oriented x 3  Medication Adjustments/Labs and Tests Ordered: Current medicines are reviewed at length with the patient today.  Concerns regarding medicines are outlined above.  Orders Placed This Encounter  Procedures   Comprehensive metabolic panel with GFR   Lipid panel   EKG 12-Lead   No orders of the defined types were placed in this encounter.   Signed, Alean jess Kobus, MD, MPH, [facility]. 12/24/2023 10:06 AM    [facility]

## 2023-12-24 NOTE — Assessment & Plan Note (Signed)
 Mild nonobstructive disease Normal cath in 2010 Calcium  score was 0 July 2013 Calcium  score in November 2017 was 53 Calcium  score September 2023 was 91 [71st percentile] Cardiac CT coronary angiogram September 2023 with CAD RADS 1 study less than 25% stenosis of proximal LAD.  Asymptomatic Good functional status. Continue with aspirin 81 mg once daily Continue lipid management as discussed above.

## 2023-12-24 NOTE — Assessment & Plan Note (Signed)
Well-controlled.  Continue amlodipine 2.5 mg once daily. 

## 2023-12-24 NOTE — Assessment & Plan Note (Addendum)
 Proceed with fasting lipid panel today. Last lipid panel March 2025 optimal control. Continue with rosuvastatin  20 mg once daily Continue with Repatha  140 mg subcutaneous injection, prescribed once every couple weeks but she has been taking it once every 3 weeks to help with her cost concerns.  She was wondering about once every 6 months to once every 10-month injection for lipid control, we discussed Leqvio. If she is further interested I can place a referral to see clinical pharmacist here in the office to further discuss this and assess her for eligibility.

## 2023-12-25 ENCOUNTER — Other Ambulatory Visit: Payer: Self-pay

## 2023-12-25 LAB — COMPREHENSIVE METABOLIC PANEL WITH GFR
ALT: 16 IU/L (ref 0–32)
AST: 22 IU/L (ref 0–40)
Albumin: 4.6 g/dL (ref 3.8–4.8)
Alkaline Phosphatase: 64 IU/L (ref 49–135)
BUN/Creatinine Ratio: 21 (ref 12–28)
BUN: 14 mg/dL (ref 8–27)
Bilirubin Total: 0.3 mg/dL (ref 0.0–1.2)
CO2: 24 mmol/L (ref 20–29)
Calcium: 9.7 mg/dL (ref 8.7–10.3)
Chloride: 103 mmol/L (ref 96–106)
Creatinine, Ser: 0.68 mg/dL (ref 0.57–1.00)
Globulin, Total: 2.1 g/dL (ref 1.5–4.5)
Glucose: 93 mg/dL (ref 70–99)
Potassium: 4.8 mmol/L (ref 3.5–5.2)
Sodium: 141 mmol/L (ref 134–144)
Total Protein: 6.7 g/dL (ref 6.0–8.5)
eGFR: 92 mL/min/1.73 (ref >59)

## 2023-12-25 LAB — LIPID PANEL
Chol/HDL Ratio: 2.2 ratio (ref 0.0–4.4)
Cholesterol, Total: 186 mg/dL (ref 100–199)
HDL: 85 mg/dL (ref >39)
LDL Chol Calc (NIH): 89 mg/dL (ref 0–99)
Triglycerides: 65 mg/dL (ref 0–149)
VLDL Cholesterol Cal: 12 mg/dL (ref 5–40)

## 2023-12-25 MED ORDER — ROSUVASTATIN CALCIUM 20 MG PO TABS
20.0000 mg | ORAL_TABLET | Freq: Every day | ORAL | 3 refills | Status: AC
Start: 2023-12-25 — End: ?

## 2023-12-25 NOTE — Progress Notes (Signed)
   12/25/2023  Patient ID: Maria Allison, female   DOB: 1950/05/07, 73 y.o.   MRN: [id number]  Pharmacy Quality Measure Review  This patient is appearing on a report for being at risk of failing the adherence measure for cholesterol (statin) medications this calendar year.   Medication: rosuvastatin  20mg  Last fill date: 08/09/23 for 90 day supply  Discussed barriers to adherence, which included holding therapy d/t needing to rule out statin related myalgia and also went through provider change.   Will pend order to Dr Sherre if wanting to write for patient, otherwise will sent message to new heart Dr.   Lang Sieve, PharmD, BCGP Clinical Pharmacist  [phone]

## 2023-12-25 NOTE — Telephone Encounter (Signed)
 Case id: [id number] Fax [phone]   Faxed labs and documentation

## 2023-12-26 ENCOUNTER — Other Ambulatory Visit (HOSPITAL_COMMUNITY): Payer: Self-pay

## 2023-12-26 NOTE — Telephone Encounter (Signed)
   PA APPROVED NO DATES GIVEN

## 2024-01-21 DIAGNOSIS — R070 Pain in throat: Secondary | ICD-10-CM | POA: Diagnosis not present

## 2024-01-28 ENCOUNTER — Ambulatory Visit: Payer: Self-pay

## 2024-01-28 NOTE — Progress Notes (Signed)
 Acute Office Visit  Subjective:    Patient ID: Maria Allison, female    DOB: Jun 14, 1950, 73 y.o.   MRN: [id number]  Chief Complaint  Patient presents with   Sinusitis    Left earache, upper teeth on left side and a headache. Was seen at [facility] last Tuesday and given cefdinir and kenalog  injx which helped some.     Discussed the use of AI scribe software for clinical note transcription with the patient, who gave verbal consent to proceed.  History of Present Illness Maria Allison is a 73 year old female who presents with erythritis and dental pain.  Oropharyngeal and dental pain - Erythritis and pain localized to the back top left molar - No recent dental procedures or fillings falling out - No current or recent fever - History of severe sore throat and headache at onset of illness  Sinus and ear symptoms - Recent sinus infection with initial presentation of severe sore throat and headache - Fluid in ear identified during urgent care visit, without pus or blood - No current fever - Received Kenalog  injection and completed a 7-day course of Cefdinir (twice daily) - Partial improvement in symptoms, but ongoing discomfort - Concern regarding ear condition due to upcoming air travel for cruise  Antibiotic use and adverse effects - Completed 7-day course of Cefdinir, taken twice daily - No known medication allergies - Remote history of yeast infections associated with antibiotic use, but none recently    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   Encounter for hepatitis C screening test for low risk patient 05/30/2022   Essential hypertension, benign 06/03/2022   GERD (gastroesophageal  reflux disease)    Hair loss 06/25/2023   Hyperlipemia    Hypertension 12/22   Insomnia 06/25/2023   Lumbar back pain 09/09/2023   Menopausal symptom 06/03/2022   Osteoporosis    Osteoporosis screening 05/30/2022   Other fatigue 06/25/2023   Pain in both lower extremities 06/25/2023   Paresthesia of bilateral legs 06/25/2023    Past Surgical History:  Procedure Laterality Date   ABDOMINAL EXPLORATION SURGERY     BREAST BIOPSY  1997   CARDIAC CATHETERIZATION  2010   No heart problem   LUMBAR DISC SURGERY     TONSILLECTOMY      Family History  Problem Relation Age of Onset   Thrombosis Other        There is a family history of portal vein thrombosis for which family screening has been recommended at [facility]   Leukemia Mother    Anemia Mother        Congestive heart disease   Clotting disorder Mother        Died @ 69 with blood clots, & leukemia   Hypertension Mother    Heart disease Father        Heart & lung disease   Kidney cancer Father    Hypertension Father    Heart disease Maternal Grandmother        Passed @ 26 Heart attack @ 58   Ulcerative colitis Maternal Grandfather    Lung cancer Paternal Grandmother    Liver cancer Paternal Grandmother  Heart disease Paternal Grandfather        Died @ 26. Heart attack   Heart disease Brother        Bypass surgery @ 46   Heart attack Brother        Passed on @ 23 w/ massive heart attack   Heart disease Maternal Aunt        A fib   Heart disease Paternal Uncle        Heart attack @ 58    Social History   Socioeconomic History   Marital status: Married    Spouse name: Not on file   Number of children: 1   Years of education: Not on file   Highest education level: Some college, no degree  Occupational History   Occupation: Retired  Tobacco Use   Smoking status: Never   Smokeless tobacco: Never  Vaping Use   Vaping status: Never Used  Substance and Sexual Activity   Alcohol use: Yes    Alcohol/week: 1.0  standard drink of alcohol    Types: 1 Glasses of wine per week    Comment: five times per week   Drug use: No   Sexual activity: Yes    Partners: Male    Birth control/protection: None  Other Topics Concern   Not on file  Social History Narrative   The patient lives in [place] with her husband.    She has one daughter who is in college at [employer].    She admits to just occasional alcohol use.  She denies any tobacco    abuse.         Social Drivers of Corporate Investment Banker Strain: Low Risk  (02/14/2023)   Overall Financial Resource Strain (CARDIA)    Difficulty of Paying Living Expenses: Not hard at all  Food Insecurity: No Food Insecurity (01/29/2024)   Hunger Vital Sign    Worried About Running Out of Food in the Last Year: Never true    Ran Out of Food in the Last Year: Never true  Transportation Needs: No Transportation Needs (01/29/2024)   PRAPARE - Administrator, Civil Service (Medical): No    Lack of Transportation (Non-Medical): No  Physical Activity: Sufficiently Active (02/14/2023)   Exercise Vital Sign    Days of Exercise per Week: 5 days    Minutes of Exercise per Session: 150+ min  Stress: No Stress Concern Present (02/14/2023)   [employer] of Occupational Health - Occupational Stress Questionnaire    Feeling of Stress : Not at all  Social Connections: Unknown (02/14/2023)   Social Connection and Isolation Panel    Frequency of Communication with Friends and Family: More than three times a week    Frequency of Social Gatherings with Friends and Family: More than three times a week    Attends Religious Services: Not on file    Active Member of Clubs or Organizations: Yes    Attends Banker Meetings: Not on file    Marital Status: Married  Intimate Partner Violence: Not At Risk (01/29/2024)   Humiliation, Afraid, Rape, and Kick questionnaire    Fear of Current or Ex-Partner: No    Emotionally Abused:  No    Physically Abused: No    Sexually Abused: No    Outpatient Medications Prior to Visit  Medication Sig Dispense Refill   amLODipine  (NORVASC ) 2.5 MG tablet TAKE 1 TABLET BY MOUTH EVERY DAY 90 tablet 3   aspirin 81  MG tablet Take 81 mg by mouth daily.     Calcium  Carbonate-Vitamin D (CALCIUM -VITAMIN D3 PO) Take by mouth daily.     Coenzyme Q10 (COQ-10 PO) Take 1 tablet by mouth daily.     EPINEPHrine  0.3 mg/0.3 mL IJ SOAJ injection Use as directed for life-threatening allergic reaction. 1 each 3   estradiol (ESTRACE) 0.1 MG/GM vaginal cream Place 1 Applicatorful vaginally daily.     Evolocumab  (REPATHA  SURECLICK) 140 MG/ML SOAJ ADMINSITER 1ML UNDER THE SKIN EVERY 14 DAYS 2 mL 0   Fish Oil OIL Take 1 tablet by mouth daily.     montelukast  (SINGULAIR ) 10 MG tablet TAKE 1 TABLET (10 MG) BY MOUTH EVERY DAY AT BEDTIME 90 tablet 3   naproxen  (NAPROSYN ) 500 MG tablet TAKE 1 TABLET BY MOUTH 2 TIMES DAILY WITH A MEAL. 60 tablet 0   omeprazole  (PRILOSEC) 40 MG capsule TAKE 1 CAPSULE (40 MG TOTAL) BY MOUTH AS NEEDED. TAKE 1 CAPSULE(40 MG) BY MOUTH DAILY 90 capsule 1   Probiotic Product (PROBIOTIC DAILY PO) Take 1 Each/kg by mouth daily.     rosuvastatin  (CRESTOR ) 20 MG tablet Take 1 tablet (20 mg total) by mouth daily. 90 tablet 3   sertraline  (ZOLOFT ) 50 MG tablet TAKE 1 TABLET BY MOUTH EVERY DAY 90 tablet 3   No facility-administered medications prior to visit.    Allergies  Allergen Reactions   Other Swelling    Fresh tree fruit and tree nut    Review of Systems  All other systems reviewed and are negative.      Objective:        01/29/2024   10:52 AM 12/24/2023    9:29 AM 10/25/2023    8:57 AM  Vitals with BMI  Height 5' 1 5' 1 5' 1  Weight 121 lbs 121 lbs 10 oz 121 lbs  BMI 22.87 22.99 22.87  Systolic 136 120 877  Diastolic 82 80 78  Pulse 74 57 66    No data found.   Physical Exam Vitals reviewed.  Constitutional:      Appearance: Normal appearance.  HENT:      Right Ear: Tympanic membrane, ear canal and external ear normal.     Left Ear: Tympanic membrane, ear canal and external ear normal.     Nose: Congestion present.     Comments: Sinus tenderness    Mouth/Throat:     Pharynx: Oropharynx is clear. Posterior oropharyngeal erythema (mild) present. No oropharyngeal exudate.   Cardiovascular:     Rate and Rhythm: Normal rate and regular rhythm.     Heart sounds: Normal heart sounds. No murmur heard. Pulmonary:     Effort: Pulmonary effort is normal. No respiratory distress.     Breath sounds: Normal breath sounds.  Lymphadenopathy:     Cervical: No cervical adenopathy.  Neurological:     Mental Status: She is alert and oriented to person, place, and time.  Psychiatric:        Mood and Affect: Mood normal.        Behavior: Behavior normal.     Health Maintenance Due  Topic Date Due   COVID-19 Vaccine (4 - 2025-26 season) 11/11/2023   Medicare Annual Wellness (AWV)  02/14/2024    There are no preventive care reminders to display for this patient.   Lab Results  Component Value Date   TSH 3.390 06/25/2023   Lab Results  Component Value Date   WBC 3.9 06/25/2023   HGB 13.0 06/25/2023  HCT 38.5 06/25/2023   MCV 93 06/25/2023   PLT 235 06/25/2023   Lab Results  Component Value Date   NA 141 12/24/2023   K 4.8 12/24/2023   CO2 24 12/24/2023   GLUCOSE 93 12/24/2023   BUN 14 12/24/2023   CREATININE 0.68 12/24/2023   BILITOT 0.3 12/24/2023   ALKPHOS 64 12/24/2023   AST 22 12/24/2023   ALT 16 12/24/2023   PROT 6.7 12/24/2023   ALBUMIN 4.6 12/24/2023   CALCIUM  9.7 12/24/2023   EGFR 92 12/24/2023   Lab Results  Component Value Date   CHOL 186 12/24/2023   Lab Results  Component Value Date   HDL 85 12/24/2023   Lab Results  Component Value Date   LDLCALC 89 12/24/2023   Lab Results  Component Value Date   TRIG 65 12/24/2023   Lab Results  Component Value Date   CHOLHDL 2.2 12/24/2023   No results  found for: HGBA1C      Results for orders placed or performed in visit on 12/24/23  Comprehensive metabolic panel with GFR   Collection Time: 12/24/23 10:25 AM  Result Value Ref Range   Glucose 93 70 - 99 mg/dL   BUN 14 8 - 27 mg/dL   Creatinine, Ser 9.31 0.57 - 1.00 mg/dL   eGFR 92 >40 fO/fpw/8.26   BUN/Creatinine Ratio 21 12 - 28   Sodium 141 134 - 144 mmol/L   Potassium 4.8 3.5 - 5.2 mmol/L   Chloride 103 96 - 106 mmol/L   CO2 24 20 - 29 mmol/L   Calcium  9.7 8.7 - 10.3 mg/dL   Total Protein 6.7 6.0 - 8.5 g/dL   Albumin 4.6 3.8 - 4.8 g/dL   Globulin, Total 2.1 1.5 - 4.5 g/dL   Bilirubin Total 0.3 0.0 - 1.2 mg/dL   Alkaline Phosphatase 64 49 - 135 IU/L   AST 22 0 - 40 IU/L   ALT 16 0 - 32 IU/L  Lipid panel   Collection Time: 12/24/23 10:25 AM  Result Value Ref Range   Cholesterol, Total 186 100 - 199 mg/dL   Triglycerides 65 0 - 149 mg/dL   HDL 85 >60 mg/dL   VLDL Cholesterol Cal 12 5 - 40 mg/dL   LDL Chol Calc (NIH) 89 0 - 99 mg/dL   Chol/HDL Ratio 2.2 0.0 - 4.4 ratio     Assessment & Plan:   Assessment & Plan Acute non-recurrent sinusitis of other sinus Recent sinus infection treated with Kenalog  and antibiotics. Nasal swelling present. Previous antibiotics completed. - Prescribed Augmentin  for 10 days, twice daily. - Advised regular use of Flonase for fluid clearance and pressure changes during flight. - Prescribed yeast infection prophylaxis with a yeast pill if needed.    Pain, dental Dental pain in left upper molar region with tenderness. No recent dental visit due to appointment issues. Previous antibiotics completed. - Prescribed Augmentin  for 10 days, twice daily. - Advised securing a dental appointment after returning from the cruise.       Body mass index is 22.86 kg/m..    Meds ordered this encounter  Medications   amoxicillin -clavulanate (AUGMENTIN ) 875-125 MG tablet    Sig: Take 1 tablet by mouth 2 (two) times daily.    Dispense:  20  tablet    Refill:  0   fluconazole  (DIFLUCAN ) 150 MG tablet    Sig: Take 1 tablet (150 mg total) by mouth once for 1 dose.    Dispense:  1 tablet  Refill:  0    No orders of the defined types were placed in this encounter.    Follow-up: No follow-ups on file.  An After Visit Summary was printed and given to the patient.  Abigail Free, MD [facility] [phone]

## 2024-01-28 NOTE — Telephone Encounter (Signed)
 FYI Only or Action Required?: FYI only for provider: appointment scheduled on 11/19.  Patient was last seen in primary care on 10/25/2023 by Teressa Harrie HERO, FNP.  Called Nurse Triage reporting Sinusitis.  Symptoms began a week ago.  Interventions attempted: Prescription medications: Cefdinir.  Symptoms are: gradually improving.  Triage Disposition: See PCP When Office is Open (Within 3 Days)  Patient/caregiver understands and will follow disposition?: Yes  Copied from CRM #[id number]. Topic: Clinical - Red Word Triage >> Jan 28, 2024  9:39 AM Sophia H wrote: Red Word that prompted transfer to Nurse Triage: Was seen in urgent care on 11/11 for a sinus infection, pain in teeth & ears. Was given a round of antibiotics and takes the last one today but patient is still experiencing ear/teeth pain. Has a flight coming up and wants to make sure this has cleared before then.  Cefdinir 300MG  - twice a day from urgent care Reason for Disposition  [1] Taking antibiotic > 7 days AND [2] nasal discharge not improved  Answer Assessment - Initial Assessment Questions Has a trip coming up next Friday and wants to make sure her ear pain has resolved. Fluctuating. UC did see fluid in her ears when she went last week. May need a few more days of abx or drops.  Appt with PCP 11/19 to assess. UC precautions understood. Has reached out to her dentist as well as her teeth have been hurting, mainly one that has already been capped.   1. ANTIBIOTIC: What antibiotic are you taking? How many times a day?     Cefdinir 300mg  BID- last dose is tonight  2. ONSET: When was the antibiotic started?     11/11 3. PAIN: How bad is the pain?   (Scale 0-10; or none, mild, moderate or severe)     3/10 but gets up 6/10- better than it was before but still painful  4. FEVER: Do you have a fever? If Yes, ask: What is it, how was it measured, and when did it start?      Denies fever 5. SYMPTOMS: Are there any other  symptoms you're concerned about? If Yes, ask: When did it start?     Sinus infection almost better but the ear and teeth pain is still persistent.  Protocols used: Sinus Infection on Antibiotic Follow-up Call-A-[facility]

## 2024-01-29 ENCOUNTER — Ambulatory Visit (INDEPENDENT_AMBULATORY_CARE_PROVIDER_SITE_OTHER): Admitting: Family Medicine

## 2024-01-29 ENCOUNTER — Encounter: Payer: Self-pay | Admitting: Family Medicine

## 2024-01-29 VITALS — BP 136/82 | HR 74 | Temp 98.2°F | Ht 61.0 in | Wt 121.0 lb

## 2024-01-29 DIAGNOSIS — K0889 Other specified disorders of teeth and supporting structures: Secondary | ICD-10-CM | POA: Diagnosis not present

## 2024-01-29 DIAGNOSIS — J018 Other acute sinusitis: Secondary | ICD-10-CM

## 2024-01-29 MED ORDER — AMOXICILLIN-POT CLAVULANATE 875-125 MG PO TABS
1.0000 | ORAL_TABLET | Freq: Two times a day (BID) | ORAL | 0 refills | Status: AC
Start: 2024-01-29 — End: ?

## 2024-01-29 MED ORDER — FLUCONAZOLE 150 MG PO TABS: 150.0000 mg | ORAL_TABLET | Freq: Once | ORAL | 0 refills | Status: AC

## 2024-02-01 DIAGNOSIS — J019 Acute sinusitis, unspecified: Secondary | ICD-10-CM | POA: Insufficient documentation

## 2024-02-01 DIAGNOSIS — K0889 Other specified disorders of teeth and supporting structures: Secondary | ICD-10-CM | POA: Insufficient documentation

## 2024-02-01 NOTE — Patient Instructions (Signed)
  VISIT SUMMARY: During your visit, we addressed your sinus infection and dental pain. You have been prescribed new medications and given advice to manage your symptoms, especially considering your upcoming travel.  YOUR PLAN: ACUTE SINUSITIS: You have a recent sinus infection with nasal swelling and fluid in your ear. You have completed a course of antibiotics but still have some discomfort. -Start taking Augmentin  twice daily for 10 days. -Use Flonase regularly to help with fluid clearance and pressure changes during your flight. -Take a yeast pill if you develop symptoms of a yeast infection.  DENTAL PAIN, LEFT UPPER MOLAR REGION: You have pain and tenderness in your left upper molar region. You have not been able to visit the dentist yet. -Start taking Augmentin  twice daily for 10 days. -Schedule a dental appointment after you return from your cruise.

## 2024-02-01 NOTE — Assessment & Plan Note (Signed)
 Recent sinus infection treated with Kenalog  and antibiotics. Nasal swelling present. Previous antibiotics completed. - Prescribed Augmentin  for 10 days, twice daily. - Advised regular use of Flonase for fluid clearance and pressure changes during flight. - Prescribed yeast infection prophylaxis with a yeast pill if needed.

## 2024-02-01 NOTE — Assessment & Plan Note (Signed)
 Dental pain in left upper molar region with tenderness. No recent dental visit due to appointment issues. Previous antibiotics completed. - Prescribed Augmentin  for 10 days, twice daily. - Advised securing a dental appointment after returning from the cruise.

## 2024-02-03 ENCOUNTER — Telehealth: Payer: Self-pay

## 2024-02-03 DIAGNOSIS — R931 Abnormal findings on diagnostic imaging of heart and coronary circulation: Secondary | ICD-10-CM

## 2024-02-03 DIAGNOSIS — E785 Hyperlipidemia, unspecified: Secondary | ICD-10-CM

## 2024-02-03 DIAGNOSIS — I2510 Atherosclerotic heart disease of native coronary artery without angina pectoris: Secondary | ICD-10-CM

## 2024-02-03 MED ORDER — REPATHA SURECLICK 140 MG/ML ~~LOC~~ SOAJ: 140.0000 mg | SUBCUTANEOUS | 1 refills | Status: AC

## 2024-02-03 NOTE — Telephone Encounter (Signed)
 *  STAT* If patient is at the pharmacy, call can be transferred to refill team.   1. Which medications need to be refilled? (please list name of each medication and dose if known)   Evolocumab  (REPATHA  SURECLICK) 140 MG/ML SOAJ   2. Which pharmacy/location (including street and city if local pharmacy) is medication to be sent to? [id number] - [place], [place] - [address]     3. Do they need a 30 day or 90 day supply? 30

## 2024-02-20 ENCOUNTER — Ambulatory Visit

## 2024-02-26 ENCOUNTER — Ambulatory Visit: Payer: Self-pay

## 2024-02-26 NOTE — Telephone Encounter (Signed)
 FYI Only or Action Required?: FYI only for provider: appointment scheduled on 02/28/2024.  Patient was last seen in primary care on 01/29/2024 by [name], MD.  Called Nurse Triage reporting Sinusitis.  Symptoms began several weeks ago.  Interventions attempted: Prescription medications: antibiotics.  Symptoms are: gradually worsening.  Triage Disposition: See Physician Within 24 Hours  Patient/caregiver understands and will follow disposition?: Yes, but will wait    Copied from CRM #[id number]. Topic: Clinical - Red Word Triage >> Feb 26, 2024  1:11 PM [name] wrote: Pt has been given antibiotics and says she is having a lot of pain in her mouth. She says the sinus infection hasn't gone away. When she eats and on the left side of upper and back moller the pain is terrible. Still having ear pain. Started last week of Oct and started antibiotic on Nov 5,2025 . Reason for Disposition  Earache  Answer Assessment - Initial Assessment Questions 1. LOCATION: Where does it hurt?      Left face, teeth, under chin 2. ONSET: When did the sinus pain start?  (e.g., hours, days)      A month ago 3. SEVERITY: How bad is the pain?   (Scale 0-10; or none, mild, moderate or severe)     4/10 increasing with eating, improved with ibuprofen 4. RECURRENT SYMPTOM: Have you ever had sinus problems before? If Yes, ask: When was the last time? and What happened that time?      ongong 5. NASAL CONGESTION: Is the nose blocked? If Yes, ask: Can you open it or must you breathe through your mouth?     Denies congestion/ drainage  7. FEVER: Do you have a fever? If Yes, ask: What is it, how was it measured, and when did it start?      no 8. OTHER SYMPTOMS: Do you have any other symptoms? (e.g., sore throat, cough, earache, difficulty breathing)     Ear ache  Protocols used: Sinus Pain or Congestion-A-AH

## 2024-02-28 ENCOUNTER — Telehealth: Admitting: Family Medicine

## 2024-02-28 VITALS — BP 128/89 | HR 56 | Ht 61.0 in | Wt 121.0 lb

## 2024-02-28 DIAGNOSIS — R591 Generalized enlarged lymph nodes: Secondary | ICD-10-CM | POA: Diagnosis not present

## 2024-02-28 DIAGNOSIS — J329 Chronic sinusitis, unspecified: Secondary | ICD-10-CM | POA: Insufficient documentation

## 2024-02-28 MED ORDER — SULFAMETHOXAZOLE-TRIMETHOPRIM 800-160 MG PO TABS: 1.0000 | ORAL_TABLET | Freq: Two times a day (BID) | ORAL | 0 refills | Status: AC

## 2024-02-28 NOTE — Assessment & Plan Note (Signed)
 Persistent earache and dental pain with recurrence of symptoms after initial improvement on Augmentin . Chronic sinus infection suspected, possibly requiring extended antibiotic therapy. - Ordered CT scan of sinuses. - Prescribed Bactrim  twice daily for two weeks. Orders:   CT SINUS WO CONTRAST; Future

## 2024-02-28 NOTE — Assessment & Plan Note (Signed)
 Tender lymph node in neck.  - Ordered CT scan of neck. Orders:   CT Soft Tissue Neck W Contrast; Future

## 2024-02-28 NOTE — Progress Notes (Signed)
 "   Virtual Visit via Video Note   This visit type was conducted per patient request This format is felt to be appropriate for this patient at this time.  All issues noted in this document were discussed and addressed.  A limited physical exam was performed with this format.  A verbal consent was obtained for the virtual visit.   Date:  02/28/2024   ID:  Maria Allison, DOB Dec 20, 1950, MRN [id number]  Patient Location: Home Provider Location: Office/Clinic  PCP:  [name], MD   Chief Complaint  Patient presents with   Sinusitis     History of Present Illness:    Discussed the use of AI scribe software for clinical note transcription with the patient, who gave verbal consent to proceed.  History of Present Illness Maria Allison is a 73 year old female who presents with persistent earache and dental pain.  Otolaryngologic pain - Persistent earache since October - Pain is pulsating in nature - Pain is exacerbated by pressure on the affected area - No associated fevers, chills, chest pain, or trouble breathing  Dental pain - Persistent pain in upper and lower teeth since October - Pain is worsened by eating on the right side, causing significant discomfort - Dental x-rays of top and bottom teeth were normal - Initial improvement with antibiotics (prescribed at urgent care on November 12th), but pain gradually returned - Previously prescribed Augmentin  which again helped, but symptoms have worsened again.  - Patient is concerned that she has painful swollen node under left jaw that has been there for some time.      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   Encounter for hepatitis C  screening test for low risk patient 05/30/2022   Essential hypertension, benign 06/03/2022   GERD (gastroesophageal reflux disease)    Hair loss 06/25/2023   Hyperlipemia    Hypertension 12/22   Insomnia 06/25/2023   Lumbar back pain 09/09/2023   Menopausal symptom 06/03/2022   Osteoporosis    Osteoporosis screening 05/30/2022   Other fatigue 06/25/2023   Pain in both lower extremities 06/25/2023   Paresthesia of bilateral legs 06/25/2023    Past Surgical History:  Procedure Laterality Date   ABDOMINAL EXPLORATION SURGERY     BREAST BIOPSY  1997   CARDIAC CATHETERIZATION  2010   No heart problem   LUMBAR DISC SURGERY     TONSILLECTOMY      Family History  Problem Relation Age of Onset   Thrombosis Other        There is a family history of portal vein thrombosis for which family screening has been recommended at [facility]   Leukemia Mother    Anemia Mother        Congestive heart disease   Clotting disorder Mother        Died @ 82 with blood clots, & leukemia   Hypertension Mother    Heart disease Father        Heart & lung disease   Kidney cancer Father    Hypertension Father    Heart disease Maternal Grandmother        Passed @ 53 Heart attack @ 51  Ulcerative colitis Maternal Grandfather    Lung cancer Paternal Grandmother    Liver cancer Paternal Grandmother    Heart disease Paternal Grandfather        Died @ 67. Heart attack   Heart disease Brother        Bypass surgery @ 46   Heart attack Brother        Passed on @ 83 w/ massive heart attack   Heart disease Maternal Aunt        A fib   Heart disease Paternal Uncle        Heart attack @ 82    Social History   Socioeconomic History   Marital status: Married    Spouse name: Not on file   Number of children: 1   Years of education: Not on file   Highest education level: Associate degree: academic program  Occupational History   Occupation: Retired  Tobacco Use   Smoking status: Never   Smokeless  tobacco: Never  Vaping Use   Vaping status: Never Used  Substance and Sexual Activity   Alcohol use: Yes    Alcohol/week: 1.0 standard drink of alcohol    Types: 1 Glasses of wine per week    Comment: five times per week   Drug use: No   Sexual activity: Yes    Partners: Male    Birth control/protection: None  Other Topics Concern   Not on file  Social History Narrative   The patient lives in [place] with her husband.    She has one daughter who is in college at [employer].    She admits to just occasional alcohol use.  She denies any tobacco    abuse.         Social Drivers of Health   Tobacco Use: Low Risk (01/29/2024)   Patient History    Smoking Tobacco Use: Never    Smokeless Tobacco Use: Never    Passive Exposure: Not on file  Financial Resource Strain: Low Risk (02/28/2024)   Overall Financial Resource Strain (CARDIA)    Difficulty of Paying Living Expenses: Not hard at all  Food Insecurity: No Food Insecurity (02/28/2024)   Epic    Worried About Programme Researcher, Broadcasting/film/video in the Last Year: Never true    Ran Out of Food in the Last Year: Never true  Transportation Needs: No Transportation Needs (02/28/2024)   Epic    Lack of Transportation (Medical): No    Lack of Transportation (Non-Medical): No  Physical Activity: Insufficiently Active (02/28/2024)   Exercise Vital Sign    Days of Exercise per Week: 2 days    Minutes of Exercise per Session: 10 min  Stress: No Stress Concern Present (02/28/2024)   [employer] of Occupational Health - Occupational Stress Questionnaire    Feeling of Stress: Not at all  Social Connections: Socially Integrated (02/28/2024)   Social Connection and Isolation Panel    Frequency of Communication with Friends and Family: More than three times a week    Frequency of Social Gatherings with Friends and Family: Twice a week    Attends Religious Services: More than 4 times per year    Active Member of [employer] or  Organizations: Yes    Attends Banker Meetings: More than 4 times per year    Marital Status: Married  Catering Manager Violence: Not At Risk (01/29/2024)   Epic    Fear of Current or Ex-Partner: No    Emotionally Abused: No  Physically Abused: No    Sexually Abused: No  Depression (PHQ2-9): Low Risk (09/09/2023)   Depression (PHQ2-9)    PHQ-2 Score: 4  Alcohol Screen: Low Risk (02/28/2024)   Alcohol Screen    Last Alcohol Screening Score (AUDIT): 3  Housing: Low Risk (02/28/2024)   Epic    Unable to Pay for Housing in the Last Year: No    Number of Times Moved in the Last Year: 0    Homeless in the Last Year: No  Utilities: Not At Risk (01/29/2024)   Epic    Threatened with loss of utilities: No  Health Literacy: Adequate Health Literacy (02/14/2023)   B1300 Health Literacy    Frequency of need for help with medical instructions: Never    Outpatient Medications Prior to Visit  Medication Sig Dispense Refill   amLODipine  (NORVASC ) 2.5 MG tablet TAKE 1 TABLET BY MOUTH EVERY DAY 90 tablet 3   amoxicillin -clavulanate (AUGMENTIN ) 875-125 MG tablet Take 1 tablet by mouth 2 (two) times daily. 20 tablet 0   aspirin 81 MG tablet Take 81 mg by mouth daily.     Calcium  Carbonate-Vitamin D (CALCIUM -VITAMIN D3 PO) Take by mouth daily.     Coenzyme Q10 (COQ-10 PO) Take 1 tablet by mouth daily.     EPINEPHrine  0.3 mg/0.3 mL IJ SOAJ injection Use as directed for life-threatening allergic reaction. 1 each 3   estradiol (ESTRACE) 0.1 MG/GM vaginal cream Place 1 Applicatorful vaginally daily.     Evolocumab  (REPATHA  SURECLICK) 140 MG/ML SOAJ Inject 140 mg into the skin every 14 (fourteen) days. 6 mL 1   Fish Oil OIL Take 1 tablet by mouth daily.     montelukast  (SINGULAIR ) 10 MG tablet TAKE 1 TABLET (10 MG) BY MOUTH EVERY DAY AT BEDTIME 90 tablet 3   naproxen  (NAPROSYN ) 500 MG tablet TAKE 1 TABLET BY MOUTH 2 TIMES DAILY WITH A MEAL. 60 tablet 0   omeprazole  (PRILOSEC) 40 MG  capsule TAKE 1 CAPSULE (40 MG TOTAL) BY MOUTH AS NEEDED. TAKE 1 CAPSULE(40 MG) BY MOUTH DAILY 90 capsule 1   Probiotic Product (PROBIOTIC DAILY PO) Take 1 Each/kg by mouth daily.     rosuvastatin  (CRESTOR ) 20 MG tablet Take 1 tablet (20 mg total) by mouth daily. 90 tablet 3   sertraline  (ZOLOFT ) 50 MG tablet TAKE 1 TABLET BY MOUTH EVERY DAY 90 tablet 3   No facility-administered medications prior to visit.    Allergies[1]   Social History[2]   ROS   Labs/Other Tests and Data Reviewed:    Recent Labs: 06/25/2023: Hemoglobin 13.0; Magnesium 2.0; Platelets 235; TSH 3.390 12/24/2023: ALT 16; BUN 14; Creatinine, Ser 0.68; Potassium 4.8; Sodium 141   Recent Lipid Panel Lab Results  Component Value Date/Time   CHOL 186 12/24/2023 10:25 AM   TRIG 65 12/24/2023 10:25 AM   HDL 85 12/24/2023 10:25 AM   CHOLHDL 2.2 12/24/2023 10:25 AM   CHOLHDL 2.5 02/15/2016 09:04 AM   LDLCALC 89 12/24/2023 10:25 AM    Wt Readings from Last 3 Encounters:  02/28/24 121 lb (54.9 kg)  01/29/24 121 lb (54.9 kg)  12/24/23 121 lb 9.6 oz (55.2 kg)     Objective:    Vital Signs:  BP 128/89   Pulse (!) 56   Ht 5' 1 (1.549 m)   Wt 121 lb (54.9 kg)   BMI 22.86 kg/m    Physical Exam Vitals reviewed.  Constitutional:      Appearance: Normal appearance.  Neurological:  Mental Status: She is alert.      ASSESSMENT & PLAN:    Assessment & Plan Recurrent sinusitis Persistent earache and dental pain with recurrence of symptoms after initial improvement on Augmentin . Chronic sinus infection suspected, possibly requiring extended antibiotic therapy. - Ordered CT scan of sinuses. - Prescribed Bactrim twice daily for two weeks. Orders:   CT SINUS WO CONTRAST; Future  Lymphadenopathy of head and neck Tender lymph node in neck.  - Ordered CT scan of neck. Orders:   CT Soft Tissue Neck W Contrast; Future    Follow Up:  In Person prn  Signed, Abigail Free, MD  02/28/2024 10:52 AM    [facility]     [1]  Allergies Allergen Reactions   Other Swelling    Fresh tree fruit and tree nut  [2]  Social History Tobacco Use   Smoking status: Never   Smokeless tobacco: Never  Vaping Use   Vaping status: Never Used  Substance Use Topics   Alcohol use: Yes    Alcohol/week: 1.0 standard drink of alcohol    Types: 1 Glasses of wine per week    Comment: five times per week   Drug use: No   "

## 2024-02-28 NOTE — Patient Instructions (Signed)
" °  VISIT SUMMARY: Today, you were seen for persistent earache and dental pain that have been ongoing since October. We discussed your symptoms and the possible causes, and we have a plan to address them.  YOUR PLAN: RECURRENT SINUSITIS: You have a persistent earache and dental pain that initially improved with antibiotics but then returned. This may be due to a chronic sinus infection. -A CT scan of your sinuses has been ordered to get a better look at what might be causing your symptoms. -You have been prescribed Bactrim  to take twice daily for two weeks.  LYMPHADENOPATHY OF HEAD AND NECK: You have a tender lymph node in your neck. -A CT scan of your neck has been ordered to investigate further.                      Contains text generated by Abridge.                                 Contains text generated by Abridge.   "

## 2024-03-26 ENCOUNTER — Ambulatory Visit (HOSPITAL_BASED_OUTPATIENT_CLINIC_OR_DEPARTMENT_OTHER)
Admission: RE | Admit: 2024-03-26 | Discharge: 2024-03-26 | Disposition: A | Source: Ambulatory Visit | Attending: Family Medicine | Admitting: Family Medicine

## 2024-03-26 DIAGNOSIS — J329 Chronic sinusitis, unspecified: Secondary | ICD-10-CM

## 2024-03-26 DIAGNOSIS — R591 Generalized enlarged lymph nodes: Secondary | ICD-10-CM

## 2024-03-26 MED ORDER — IOHEXOL 300 MG/ML  SOLN
75.0000 mL | Freq: Once | INTRAMUSCULAR | Status: AC | PRN
  Administered 2024-03-26: 75 mL via INTRAVENOUS

## 2024-03-31 ENCOUNTER — Ambulatory Visit: Payer: Self-pay | Admitting: Family Medicine

## 2024-05-12 ENCOUNTER — Ambulatory Visit
